# Patient Record
Sex: Female | Born: 1970 | Race: Black or African American | Hispanic: No | Marital: Married | Smoking: Never smoker
Health system: Southern US, Community
[De-identification: ages and names within clinical notes are randomized; demographics above are authoritative.]

## PROBLEM LIST (undated history)

## (undated) DIAGNOSIS — D75A Glucose-6-phosphate dehydrogenase (G6PD) deficiency without anemia: Secondary | ICD-10-CM

## (undated) DIAGNOSIS — B009 Herpesviral infection, unspecified: Secondary | ICD-10-CM

## (undated) DIAGNOSIS — F329 Major depressive disorder, single episode, unspecified: Secondary | ICD-10-CM

## (undated) DIAGNOSIS — D649 Anemia, unspecified: Secondary | ICD-10-CM

## (undated) DIAGNOSIS — F32A Depression, unspecified: Secondary | ICD-10-CM

## (undated) DIAGNOSIS — E559 Vitamin D deficiency, unspecified: Secondary | ICD-10-CM

## (undated) DIAGNOSIS — T7840XA Allergy, unspecified, initial encounter: Secondary | ICD-10-CM

## (undated) HISTORY — DX: Glucose-6-phosphate dehydrogenase (G6PD) deficiency without anemia: D75.A

## (undated) HISTORY — PX: ABLATION: SHX5711

## (undated) HISTORY — DX: Depression, unspecified: F32.A

## (undated) HISTORY — DX: Anemia, unspecified: D64.9

## (undated) HISTORY — DX: Herpesviral infection, unspecified: B00.9

## (undated) HISTORY — DX: Vitamin D deficiency, unspecified: E55.9

## (undated) HISTORY — DX: Allergy, unspecified, initial encounter: T78.40XA

## (undated) HISTORY — DX: Major depressive disorder, single episode, unspecified: F32.9

---

## 1994-11-30 HISTORY — PX: WISDOM TOOTH EXTRACTION: SHX21

## 1998-09-18 ENCOUNTER — Other Ambulatory Visit: Admission: RE | Admit: 1998-09-18 | Discharge: 1998-09-18 | Payer: Self-pay | Admitting: *Deleted

## 2000-06-18 ENCOUNTER — Inpatient Hospital Stay (HOSPITAL_COMMUNITY): Admission: AD | Admit: 2000-06-18 | Discharge: 2000-06-20 | Payer: Self-pay | Admitting: Obstetrics and Gynecology

## 2000-07-15 ENCOUNTER — Encounter: Admission: RE | Admit: 2000-07-15 | Discharge: 2000-10-13 | Payer: Self-pay | Admitting: Obstetrics and Gynecology

## 2000-10-15 ENCOUNTER — Encounter: Admission: RE | Admit: 2000-10-15 | Discharge: 2000-12-17 | Payer: Self-pay | Admitting: Obstetrics and Gynecology

## 2001-04-04 ENCOUNTER — Encounter: Admission: RE | Admit: 2001-04-04 | Discharge: 2001-04-04 | Payer: Self-pay | Admitting: Internal Medicine

## 2001-04-04 ENCOUNTER — Encounter: Payer: Self-pay | Admitting: Internal Medicine

## 2001-04-14 ENCOUNTER — Encounter: Admission: RE | Admit: 2001-04-14 | Discharge: 2001-07-13 | Payer: Self-pay | Admitting: Internal Medicine

## 2001-10-06 ENCOUNTER — Other Ambulatory Visit: Admission: RE | Admit: 2001-10-06 | Discharge: 2001-10-06 | Payer: Self-pay | Admitting: Obstetrics and Gynecology

## 2002-12-21 ENCOUNTER — Other Ambulatory Visit: Admission: RE | Admit: 2002-12-21 | Discharge: 2002-12-21 | Payer: Self-pay | Admitting: Obstetrics and Gynecology

## 2003-03-14 ENCOUNTER — Ambulatory Visit (HOSPITAL_COMMUNITY): Admission: RE | Admit: 2003-03-14 | Discharge: 2003-03-14 | Payer: Self-pay | Admitting: Obstetrics and Gynecology

## 2003-03-14 ENCOUNTER — Encounter: Payer: Self-pay | Admitting: Obstetrics and Gynecology

## 2003-07-21 ENCOUNTER — Inpatient Hospital Stay (HOSPITAL_COMMUNITY): Admission: AD | Admit: 2003-07-21 | Discharge: 2003-07-23 | Payer: Self-pay | Admitting: Obstetrics and Gynecology

## 2003-12-24 ENCOUNTER — Other Ambulatory Visit: Admission: RE | Admit: 2003-12-24 | Discharge: 2003-12-24 | Payer: Self-pay | Admitting: Obstetrics and Gynecology

## 2004-12-25 ENCOUNTER — Other Ambulatory Visit: Admission: RE | Admit: 2004-12-25 | Discharge: 2004-12-25 | Payer: Self-pay | Admitting: Obstetrics and Gynecology

## 2005-05-01 ENCOUNTER — Encounter: Admission: RE | Admit: 2005-05-01 | Discharge: 2005-05-01 | Payer: Self-pay | Admitting: Internal Medicine

## 2005-06-05 ENCOUNTER — Encounter: Admission: RE | Admit: 2005-06-05 | Discharge: 2005-06-05 | Payer: Self-pay | Admitting: Internal Medicine

## 2006-01-06 ENCOUNTER — Other Ambulatory Visit: Admission: RE | Admit: 2006-01-06 | Discharge: 2006-01-06 | Payer: Self-pay | Admitting: Obstetrics and Gynecology

## 2007-03-30 ENCOUNTER — Emergency Department (HOSPITAL_COMMUNITY): Admission: EM | Admit: 2007-03-30 | Discharge: 2007-03-30 | Payer: Self-pay | Admitting: Emergency Medicine

## 2010-08-06 ENCOUNTER — Encounter: Admission: RE | Admit: 2010-08-06 | Discharge: 2010-08-06 | Payer: Self-pay | Admitting: Obstetrics and Gynecology

## 2010-08-08 ENCOUNTER — Encounter: Admission: RE | Admit: 2010-08-08 | Discharge: 2010-08-08 | Payer: Self-pay | Admitting: Obstetrics and Gynecology

## 2010-08-11 ENCOUNTER — Encounter: Admission: RE | Admit: 2010-08-11 | Discharge: 2010-08-11 | Payer: Self-pay | Admitting: Obstetrics and Gynecology

## 2011-04-17 NOTE — H&P (Signed)
NAME:  Mackenzie Jones, Mackenzie Jones                             ACCOUNT NO.:  [id number]   MEDICAL RECORD NO.:  [id number]                   PATIENT TYPE:  INP   LOCATION:  9306                                 FACILITY:  [facility]   PHYSICIAN:  Mackenzie Jones, C.N.M.               DATE OF BIRTH:  Aug 03, 1971   DATE OF ADMISSION:  07/21/2003  DATE OF DISCHARGE:                                HISTORY & PHYSICAL   Mackenzie Jones is a 40 year old married black female gravida 3, para 1-0-1-1 at 41-  4/7 weeks who presents with the urge to push.  She denies ruptured membranes  but she has had positive bloody show and irregular uterine contractions.  Her pregnancy has been followed the [facility] M.D. service and  has been remarkable for (1)  History of HSV.  (2)  Irregular cycles.  (3)  First trimester spotting.  (4)  Positive Group B strep.  Her prenatal labs  were collected on December 21, 2002 hemoglobin was 11.9, hematocrit 35.0,  platelets 315,000, blood type AB positive, antibody negative, RPR  nonreactive, rubella immune, hepatitis B surface antigen negative, HIV  nonreactive, Pap smear within normal limits.  Cystic fibrosis testing  negative.  On May 04, 2003, her one hour Glucola was 123 and her hemoglobin  at that time was 10.4.  Culture of the vaginal tract for Group B strep on  July 03, 2003 was positive and gonorrhea and Chlamydia, at that same time,  were negative.   HISTORY OF PRESENT PREGNANCY:  She presented for care are [facility]  OB/GYN  on December 21, 2002 at about eight weeks gestation.  Pregnancy  ultrasonography, at that time, showed an eight week one day pregnancy with  positive cardiac activity.  Second pregnancy ultrasonography at [redacted] weeks  gestation showed growth consistent with previous dating, an EIF in the left  ventricle was noted.  Repeat pregnancy ultrasonography, at [facility],  showed the EIF as well but no other findings that suggested trisomy 21.  The  rest  of her prenatal care has been unremarkable.   OB HISTORY:  She is a gravida 3, para 1-0-1-1.  In 1990 at eight weeks  gestation she had a spontaneous AB.  In July 2001 she vaginally delivered a  female infant weighing 6 pounds and 9 ounces at [redacted] weeks gestation after 17  hours of labor.  She had an epidural for anesthesia.  The infant's name was  Mackenzie Jones.   MEDICAL HISTORY:  1. She gets hives from FLAGYL and SULFA.  2. She has used oral contraceptives in the past.  3. She has been diagnosed with HSV with sporadic outbreaks.   SURGICAL HISTORY:  1. Remarkable for wisdom tooth extraction.   FAMILY MEDICAL HISTORY:  Negative.   GENETIC HISTORY:  Negative.   SOCIAL HISTORY:  Patient is married to the father of the baby.  His  name is  Mackenzie Jones.  They are of the [employer] faith.  They are both college educated and  employed full time.  Deny any alcohol, tobacco, or illicit drug use with the  pregnancy.   OBJECTIVE DATA:  VITAL SIGNS:  Stable.  She is afebrile.  HEENT:  Grossly within normal limits.  CHEST:  Clear to auscultation.  HEART:  Regular rate and rhythm.  ABDOMEN:  Gravid in contour with fundal height extending approximately 40-cm  above pubic symphysis.  Fetal heart rate is intermittent in the 140s.  She  is having frequent uterine contractions.  Cervix is completely dilated, 100%  effaced with bulging membranes.   ASSESSMENT:  1. Intrauterine pregnancy at term.  2. Beginning second stage.   PLAN:  Admit to [facility] and anticipate normal spontaneous vaginal  birth.                                               Mackenzie Jones, C.N.M.    KS/MEDQ  D:  07/21/2003  T:  07/21/2003  Job:  [id number]

## 2011-04-17 NOTE — H&P (Signed)
[facility]  Patient:    Mackenzie Jones, Mackenzie Jones                          MRN: [id number] Adm. Date:  [id number] Attending:  Pleas Koch Dictator:   Mack Guise, C.N.M.                         History and Physical  HISTORY OF PRESENT ILLNESS:   [name] is a 40 year old, gravida 2, para 0-0-1-0, at 4 weeks, EDD July 02, 2000, who presents with spontaneous rupture of membranes at home at approximately 3 this morning for clear fluid. She was not having any contractions prior to rupture of membranes, she was asleep but had been having some irregular contractions earlier in the afternoon prior.  She reports positive fetal movement, no bleeding.  She is now contracting mildly and irregularly.  She denies any headache, visual changes, or epigastric pain.  She does have a history of HSV but does not have any current outbreak per her report.  PRENATAL COURSE:              Her pregnancy has been followed by the MD service at [facility] and is remarkable for:                               1. History of HSV.                               2. Irregular cycles.                               3. Group B strep positive.  The patient was initially evaluated at the office of [facility] on November 12, 1999, at approximately six weeks gestation.  Her pregnancy has been essentially unremarkable.  Her EDD was determined by early pregnancy ultrasonography at eight weeks zero days due to irregular cycles, and throughout the prenatal course the growth of the uterine fundus has been noted to be compatible in size with her dates.  Systolic blood pressure has ranged from 100 to 110.  Diastolic blood pressure has ranged from 60 to 70.  There has been no significant proteinuria.  PRENATAL LABORATORY DATA:     Prenatal blood work on November 25, 1999, hemoglobulin and hematocrit 12.3 and 35.8, respectively, platelets 408,000. Blood type AB positive.  Antibody screen negative.  Sickle  cell trait negative.  VDRL nonreactive.  Rubella-immune.  Hepatitis B surface antigen negative.  AFT/free beta hCG within normal range on Apr 14, 2000, at 28 weeks. One-hour glucose challenge 126 and hemoglobin 10.4.  At 36 weeks culture of the vaginal tract is positive for group B strep.  OBSTETRICAL HISTORY:          In 1990 spontaneous AB with no complications in the present pregnancy.  PAST MEDICAL HISTORY:         History of HSV.  No outbreaks with this pregnancy.  PAST SURGICAL HISTORY:        Wisdom teeth.  FAMILY HISTORY:               Unremarkable.  GENETIC HISTORY:              There is no family history  of genetic or chromosomal disorders, children that died in infancy or that were born with birth defects.  ALLERGIES:                    FLAGYL and SULFA which cause hives.  HABITS:                       The patient denies the use of tobacco, alcohol, or illicit drugs.  SOCIAL HISTORY:               [name] is a 40 year old African-American married female.  Her husband Oleva Koo is involved and supportive.  They are of the [facility] [employer] faith.  OBJECTIVE:  VITAL SIGNS:                  Stable, afebrile.  HEENT:                        Unremarkable.  Thyroid is not enlarged.  HEART:                        Regular rate and rhythm.  LUNGS:                        Clear.  ABDOMEN:                      Gravid and contour.  Uterine and fundus is noted to extend 38 cm above the pubic symphysis.  Leopold maneuver finds the infant to be longitudinal lie, cephalic presentation and the estimated fetal weight is 7 pounds.  PELVIC:                       Sterile speculum examination finds grossly ruptured membranes.  No HSV lesions noted externally or internally on examination.  Nitrazine positive.  Fern positive.  Positive pooling.  Cervix is 2 cm dilated, 90% effaced with the cephalic presenting part at a -1 station and the baseline of the heart rate monitor is 140 with  average long-term variability.  Reactivity is present with no periodic changes.  ASSESSMENT:                   Intrauterine pregnancy, at term, premature rupture of membranes, early labor.  PLAN:                         Admit per Dr. Trudie Reed, routine MD orders. Begin penicillin G prophylaxis for positive group B strep. DD:  06/18/00 TD:  06/18/00 Job: [id number]

## 2011-07-07 ENCOUNTER — Other Ambulatory Visit: Payer: Self-pay | Admitting: Obstetrics and Gynecology

## 2011-07-07 DIAGNOSIS — Z1231 Encounter for screening mammogram for malignant neoplasm of breast: Secondary | ICD-10-CM

## 2011-08-13 ENCOUNTER — Ambulatory Visit: Payer: Self-pay

## 2011-08-28 ENCOUNTER — Ambulatory Visit: Payer: Self-pay

## 2011-09-03 ENCOUNTER — Ambulatory Visit
Admission: RE | Admit: 2011-09-03 | Discharge: 2011-09-03 | Disposition: A | Discharge status: Home / Self Care | Payer: BC Managed Care – PPO | Source: Ambulatory Visit | Attending: Obstetrics and Gynecology | Admitting: Obstetrics and Gynecology

## 2011-09-03 DIAGNOSIS — Z1231 Encounter for screening mammogram for malignant neoplasm of breast: Secondary | ICD-10-CM

## 2011-12-01 HISTORY — PX: OTHER SURGICAL HISTORY: SHX169

## 2012-06-21 ENCOUNTER — Other Ambulatory Visit: Payer: Self-pay | Admitting: Obstetrics and Gynecology

## 2012-06-21 DIAGNOSIS — Z1231 Encounter for screening mammogram for malignant neoplasm of breast: Secondary | ICD-10-CM

## 2012-09-06 ENCOUNTER — Ambulatory Visit: Payer: BC Managed Care – PPO

## 2012-09-28 ENCOUNTER — Telehealth: Payer: Self-pay | Admitting: Obstetrics and Gynecology

## 2012-09-28 ENCOUNTER — Encounter: Payer: Self-pay | Admitting: Obstetrics and Gynecology

## 2012-09-28 ENCOUNTER — Ambulatory Visit (INDEPENDENT_AMBULATORY_CARE_PROVIDER_SITE_OTHER): Payer: BC Managed Care – PPO | Admitting: Obstetrics and Gynecology

## 2012-09-28 VITALS — BP 120/78 | Wt 170.0 lb

## 2012-09-28 DIAGNOSIS — A499 Bacterial infection, unspecified: Secondary | ICD-10-CM

## 2012-09-28 DIAGNOSIS — Z113 Encounter for screening for infections with a predominantly sexual mode of transmission: Secondary | ICD-10-CM

## 2012-09-28 DIAGNOSIS — N898 Other specified noninflammatory disorders of vagina: Secondary | ICD-10-CM

## 2012-09-28 DIAGNOSIS — B9689 Other specified bacterial agents as the cause of diseases classified elsewhere: Secondary | ICD-10-CM

## 2012-09-28 DIAGNOSIS — N760 Acute vaginitis: Secondary | ICD-10-CM

## 2012-09-28 LAB — POCT WET PREP (WET MOUNT)

## 2012-09-28 MED ORDER — CLINDAMYCIN HCL 300 MG PO CAPS: 300.0000 mg | ORAL_CAPSULE | Freq: Two times a day (BID) | ORAL | Status: DC

## 2012-09-28 NOTE — Progress Notes (Signed)
Color: white Odor: yes Itching:no Thin:yes Thick:no Fever:no Dyspareunia:no Hx PID:no HX ZOX:[id number] Pelvic Pain:no Desires Gc/CT:yes Desires HIV,RPR,HbsAG:no

## 2012-09-28 NOTE — Patient Instructions (Addendum)
Bacterial Vaginosis Bacterial vaginosis (BV) is a vaginal infection where the normal balance of bacteria in the vagina is disrupted. The normal balance is then replaced by an overgrowth of certain bacteria. There are several different kinds of bacteria that can cause BV. BV is the most common vaginal infection in women of childbearing age. CAUSES   The cause of BV is not fully understood. BV develops when there is an increase or imbalance of harmful bacteria.  Some activities or behaviors can upset the normal balance of bacteria in the vagina and put women at increased risk including:  Having a new sex partner or multiple sex partners.  Douching.  Using an intrauterine device (IUD) for contraception.  It is not clear what role sexual activity plays in the development of BV. However, women that have never had sexual intercourse are rarely infected with BV. Women do not get BV from toilet seats, bedding, swimming pools or from touching objects around them.  SYMPTOMS   Grey vaginal discharge.  A fish-like odor with discharge, especially after sexual intercourse.  Itching or burning of the vagina and vulva.  Burning or pain with urination.  Some women have no signs or symptoms at all. DIAGNOSIS  Your caregiver must examine the vagina for signs of BV. Your caregiver will perform lab tests and look at the sample of vaginal fluid through a microscope. They will look for bacteria and abnormal cells (clue cells), a pH test higher than 4.5, and a positive amine test all associated with BV.  RISKS AND COMPLICATIONS   Pelvic inflammatory disease (PID).  Infections following gynecology surgery.  Developing HIV.  Developing herpes virus. TREATMENT  Sometimes BV will clear up without treatment. However, all women with symptoms of BV should be treated to avoid complications, especially if gynecology surgery is planned. Female partners generally do not need to be treated. However, BV may spread  between female sex partners so treatment is helpful in preventing a recurrence of BV.   BV may be treated with antibiotics. The antibiotics come in either pill or vaginal cream forms. Either can be used with nonpregnant or pregnant women, but the recommended dosages differ. These antibiotics are not harmful to the baby.  BV can recur after treatment. If this happens, a second round of antibiotics will often be prescribed.  Treatment is important for pregnant women. If not treated, BV can cause a premature delivery, especially for a pregnant woman who had a premature birth in the past. All pregnant women who have symptoms of BV should be checked and treated.  For chronic reoccurrence of BV, treatment with a type of prescribed gel vaginally twice a week is helpful. HOME CARE INSTRUCTIONS   Finish all medication as directed by your caregiver.  Do not have sex until treatment is completed.  Tell your sexual partner that you have a vaginal infection. They should see their caregiver and be treated if they have problems, such as a mild rash or itching.  Practice safe sex. Use condoms. Only have 1 sex partner. PREVENTION  Basic prevention steps can help reduce the risk of upsetting the natural balance of bacteria in the vagina and developing BV:  Do not have sexual intercourse (be abstinent).  Do not douche.  Use all of the medicine prescribed for treatment of BV, even if the signs and symptoms go away.  Tell your sex partner if you have BV. That way, they can be treated, if needed, to prevent reoccurrence. SEEK MEDICAL CARE IF:     Your symptoms are not improving after 3 days of treatment.  You have increased discharge, pain, or fever. MAKE SURE YOU:   Understand these instructions.  Will watch your condition.  Will get help right away if you are not doing well or get worse. FOR MORE INFORMATION  Division of STD Prevention (DSTDP), Centers for Disease Control and Prevention:  www.cdc.gov/std American Social Health Association (ASHA): www.ashastd.org  Document Released: 11/16/2005 Document Revised: 02/08/2012 Document Reviewed: 05/09/2009 ExitCare Patient Information 2013 [employer].   Avoid: - excess soap on genital area (consider using plain oatmeal soap) - use of powder or sprays in genital area - douching - wearing underwear to bed (except with menses) - using more than is directed detergent when washing clothes - tight fitting garments around genital area - excess sugar intake   

## 2012-09-28 NOTE — Progress Notes (Signed)
41 YO for GC/CT  testing complaining of malodorous vaginal discharge.  O:  Pelvic:  EGBUS-wnl, vagina-thin grey discharge, cervix-no lesions, uterus/adnexae-normal  Wet Prep: ph-5.0,  whiff-positive,  many clue cells  A: Bacterial Vaginosis     Flagyl allergy (hives)  P:  Perineal hygiene       Clindamycin 300 mg #14 bid x 7 days       RTO-as scheduled or prn  Glen Blatchley, PA-C

## 2012-10-04 ENCOUNTER — Other Ambulatory Visit: Payer: Self-pay | Admitting: Obstetrics and Gynecology

## 2012-10-04 MED ORDER — FLUCONAZOLE 150 MG PO TABS: ORAL_TABLET | ORAL | Status: DC

## 2012-10-04 NOTE — Telephone Encounter (Signed)
Patient treated within the last week for BV, declined yeast prophalaxis at the time, now requesting yeast medication.  Patient may have Diflucan 150 mg #1 1 po stat with 1 refill to use 3 days later prn  or she may have Terazol 7 Vaginal Cream #1 tube 1 applicatorful pv qhs x 7 days with no refill.  Brennan Karam, PA-C

## 2012-10-04 NOTE — Telephone Encounter (Signed)
Medication Refill

## 2012-10-04 NOTE — Telephone Encounter (Signed)
Tc to pt regarding EP msg, pt chooses Diflucan, rx e-prescribed to pt's pharmacy.

## 2012-10-21 ENCOUNTER — Ambulatory Visit
Admission: RE | Admit: 2012-10-21 | Discharge: 2012-10-21 | Disposition: A | Discharge status: Home / Self Care | Payer: BC Managed Care – PPO | Source: Ambulatory Visit | Attending: Obstetrics and Gynecology | Admitting: Obstetrics and Gynecology

## 2012-10-21 DIAGNOSIS — Z1231 Encounter for screening mammogram for malignant neoplasm of breast: Secondary | ICD-10-CM

## 2012-11-11 ENCOUNTER — Ambulatory Visit (INDEPENDENT_AMBULATORY_CARE_PROVIDER_SITE_OTHER): Payer: BC Managed Care – PPO | Admitting: Obstetrics and Gynecology

## 2012-11-11 ENCOUNTER — Encounter: Payer: Self-pay | Admitting: Obstetrics and Gynecology

## 2012-11-11 VITALS — BP 118/78 | Ht 60.0 in | Wt 155.0 lb

## 2012-11-11 DIAGNOSIS — Z01419 Encounter for gynecological examination (general) (routine) without abnormal findings: Secondary | ICD-10-CM

## 2012-11-11 MED ORDER — VALACYCLOVIR HCL 500 MG PO TABS: 500.0000 mg | ORAL_TABLET | Freq: Every day | ORAL | Status: AC

## 2012-11-11 NOTE — Progress Notes (Signed)
ANNUAL GYNECOLOGIC EXAMINATION   Mackenzie Jones is a 41 y.o. female, G3P2, who presents for an annual exam. She is doing well. She had an endometrial ablation in 2009. She occasionally has bleeding, but is very pleased. She has herpes but has  Infrequent outbreaks.   History   Social History  . Marital Status: Married    Spouse Name: N/A    Number of Children: N/A  . Years of Education: N/A   Social History Main Topics  . Smoking status: Never Smoker   . Smokeless tobacco: Never Used  . Alcohol Use: No  . Drug Use: No  . Sexually Active: Yes    Birth Control/ Protection: Surgical     Comment: vas   Other Topics Concern  . None   Social History Narrative  . None    Menstrual cycle:   LMP: No LMP recorded. Patient has had an ablation.             The following portions of the patient's history were reviewed and updated as appropriate: allergies, current medications, past family history, past medical history, past social history, past surgical history and problem list.  Review of Systems Pertinent items are noted in HPI. Breast:Negative for breast lump,nipple discharge or nipple retraction Gastrointestinal: Negative for abdominal pain, change in bowel habits or rectal bleeding Urinary:negative   Objective:    BP 118/78  Ht 5' (1.524 m)  Wt 155 lb (70.308 kg)  BMI 30.27 kg/m2    Weight:  Wt Readings from Last 1 Encounters:  11/11/12 155 lb (70.308 kg)          BMI: Body mass index is 30.27 kg/(m^2).  General Appearance: Alert, appropriate appearance for age. No acute distress HEENT: Grossly normal Neck / Thyroid: Supple, no masses, nodes or enlargement Lungs: clear to auscultation bilaterally Back: No CVA tenderness Breast Exam: No masses or nodes.No dimpling, nipple retraction or discharge. Cardiovascular: Regular rate and rhythm. S1, S2, no murmur Gastrointestinal: Soft, non-tender, no masses or  organomegaly  ++++++++++++++++++++++++++++++++++++++++++++++++++++++++  Pelvic Exam: External genitalia: normal general appearance Vaginal: normal without tenderness, induration or masses. Relaxation: No Cervix: normal appearance Adnexa: normal bimanual exam Uterus: normal size, shape, and consistency Rectovaginal: normal rectal, no masses  ++++++++++++++++++++++++++++++++++++++++++++++++++++++++  Lymphatic Exam: Non-palpable nodes in neck, clavicular, axillary, or inguinal regions Neurologic: Normal speech, no tremor  Psychiatric: Alert and oriented, appropriate affect.  Assessment:    Normal gyn exam   Overweight or obese: Yes   Pelvic relaxation: No  Herpes    Plan:    mammogram pap smear return annually or prn Contraception:vasectomy    Medications prescribed: Valtrex 500 mg daily  STD screen request: No   The updated Pap smear screening guidelines were discussed with the patient. The patient requested that I obtain a Pap smear: Yes.  Kegel exercises discussed: Yes.  Proper diet and regular exercise were reviewed.  Annual mammograms recommended starting at age 21. Proper breast care was discussed.  Screening colonoscopy is recommended beginning at age 28.  Regular health maintenance was reviewed.  Sleep hygiene was discussed.  Adequate calcium and vitamin D intake was emphasized.  Leonard Schwartz M.D.    Regular Periods: no Ablation  Mammogram: yes  Monthly Breast Ex.: yes Exercise: yes  Tetanus < 10 years: yes Seatbelts: yes  NI. Bladder Functn.: yes Abuse at home: no  Daily BM's: yes Stressful Work: no   Healthy Diet: yes Sigmoid-Colonoscopy: yes years ago   Calcium: yes Medical problems this year:  none    LAST PAP:01/2009  Contraception: vas/ablation  Mammogram:  09/2012  PCP: Dr.Shelton PMH: unchanged   FMH: unchanged   Pt stated no issues today.

## 2012-11-11 NOTE — Addendum Note (Signed)
Addended by: Tim Lair on: 11/11/2012 10:30 AM   Modules accepted: Orders

## 2012-11-14 LAB — PAP IG W/ RFLX HPV ASCU

## 2013-09-20 ENCOUNTER — Other Ambulatory Visit: Payer: Self-pay

## 2013-09-20 DIAGNOSIS — Z1231 Encounter for screening mammogram for malignant neoplasm of breast: Secondary | ICD-10-CM

## 2013-10-24 ENCOUNTER — Ambulatory Visit
Admission: RE | Admit: 2013-10-24 | Discharge: 2013-10-24 | Disposition: A | Discharge status: Home / Self Care | Payer: BC Managed Care – PPO | Source: Ambulatory Visit

## 2013-10-24 DIAGNOSIS — Z1231 Encounter for screening mammogram for malignant neoplasm of breast: Secondary | ICD-10-CM

## 2014-08-21 ENCOUNTER — Other Ambulatory Visit: Payer: Self-pay

## 2014-08-21 DIAGNOSIS — Z1231 Encounter for screening mammogram for malignant neoplasm of breast: Secondary | ICD-10-CM

## 2014-10-01 ENCOUNTER — Encounter: Payer: Self-pay | Admitting: Obstetrics and Gynecology

## 2014-10-29 ENCOUNTER — Ambulatory Visit
Admission: RE | Admit: 2014-10-29 | Discharge: 2014-10-29 | Disposition: A | Discharge status: Home / Self Care | Payer: BC Managed Care – PPO | Source: Ambulatory Visit

## 2014-10-29 DIAGNOSIS — Z1231 Encounter for screening mammogram for malignant neoplasm of breast: Secondary | ICD-10-CM

## 2015-06-11 ENCOUNTER — Other Ambulatory Visit: Payer: Self-pay | Admitting: Family Medicine

## 2015-06-11 ENCOUNTER — Ambulatory Visit (INDEPENDENT_AMBULATORY_CARE_PROVIDER_SITE_OTHER): Payer: Self-pay | Admitting: Family Medicine

## 2015-06-11 ENCOUNTER — Encounter: Payer: Self-pay | Admitting: Family Medicine

## 2015-06-11 VITALS — BP 126/81 | HR 81 | Ht 60.0 in | Wt 167.0 lb

## 2015-06-11 DIAGNOSIS — E669 Obesity, unspecified: Secondary | ICD-10-CM

## 2015-06-11 DIAGNOSIS — M7042 Prepatellar bursitis, left knee: Secondary | ICD-10-CM

## 2015-06-11 DIAGNOSIS — Z6834 Body mass index (BMI) 34.0-34.9, adult: Secondary | ICD-10-CM | POA: Insufficient documentation

## 2015-06-11 DIAGNOSIS — M7040 Prepatellar bursitis, unspecified knee: Secondary | ICD-10-CM | POA: Insufficient documentation

## 2015-06-11 LAB — CBC
HCT: 38 % (ref 36.0–46.0)
Hemoglobin: 12.7 g/dL (ref 12.0–15.0)
MCH: 29.2 pg (ref 26.0–34.0)
MCHC: 33.4 g/dL (ref 30.0–36.0)
MCV: 87.4 fL (ref 78.0–100.0)
MPV: 9.4 fL (ref 8.6–12.4)
Platelets: 345 10*3/uL (ref 150–400)
RBC: 4.35 MIL/uL (ref 3.87–5.11)
RDW: 13.3 % (ref 11.5–15.5)
WBC: 8.1 10*3/uL (ref 4.0–10.5)

## 2015-06-11 LAB — LIPID PANEL
Cholesterol: 222 mg/dL — ABNORMAL HIGH (ref 0–200)
HDL: 44 mg/dL — ABNORMAL LOW (ref >=46)
LDL Cholesterol: 142 mg/dL — ABNORMAL HIGH (ref 0–99)
Total CHOL/HDL Ratio: 5 Ratio
Triglycerides: 182 mg/dL — ABNORMAL HIGH (ref <150)
VLDL: 36 mg/dL (ref 0–40)

## 2015-06-11 MED ORDER — PHENTERMINE HCL 37.5 MG PO CAPS: 37.5000 mg | ORAL_CAPSULE | ORAL | Status: DC

## 2015-06-11 MED ORDER — DICLOFENAC SODIUM 1 % TD GEL: 2.0000 g | Freq: Four times a day (QID) | TRANSDERMAL | Status: DC

## 2015-06-11 NOTE — Patient Instructions (Signed)
Thank you for coming in today. Work on eccentric ( negative) squats with an end knee angle greater than 90 Start phentermine in the morning. Return in one month to recheck weight and blood pressure. I will call if labs are significantly abnormal otherwise will discuss them at the next visit.  Prepatellar Bursitis with Rehab  Bursitis is a condition that is characterized by inflammation of a bursa. Saunders Revel exists in many areas of the body. They are fluid-filled sacs that lie between a soft tissue (skin, tendon, or ligament) and a bone, and they reduce friction between the structures as well as the stress placed on the soft tissue. Prepatellar bursitis is inflammation of the bursa that lies between the skin and the kneecap (patella). This condition often causes pain over the patella. SYMPTOMS   Pain, tenderness, and/or inflammation over the patella.  Pain that worsens with movement of the knee joint.  Decreased range of motion for the knee joint.  A crackling sound (crepitation) when the bursa is moved or touched.  Occasionally, painless swelling of the bursa.  Fever (when infected). CAUSES  Bursitis is caused by damage to the bursa, which results in an inflammatory response. Common mechanisms of injury include:  Direct trauma to the front of the knee.  Repetitive and/or stressful use of the knee. RISK INCREASES WITH:  Activities in which kneeling and/or falling on one's knees is likely (volleyball or football).  Repetitive and stressful training, especially if it involves running on hills.  Improper training techniques, such as a sudden increase in the intensity, frequency, or duration of training.  Failure to warm up properly before activity.  Poor technique.  Artificial turf. PREVENTION   Avoid kneeling or falling on your knees.  Warm up and stretch properly before activity.  Allow for adequate recovery between workouts.  Maintain physical fitness:  Strength,  flexibility, and endurance.  Cardiovascular fitness.  Learn and use proper technique. When possible, have a coach correct improper technique.  Wear properly fitted and padded protective equipment (kneepads). PROGNOSIS  If treated properly, then the symptoms of prepatellar bursitis usually resolve within 2 weeks. RELATED COMPLICATIONS   Recurrent symptoms that result in a chronic problem.  Prolonged healing time, if improperly treated or reinjured.  Limited range of motion.  Infection of bursa.  Chronic inflammation or scarring of bursa. TREATMENT  Treatment initially involves the use of ice and medication to help reduce pain and inflammation. The use of strengthening and stretching exercises may help reduce pain with activity, especially those of the quadriceps and hamstring muscles. These exercises may be performed at home or with referral to a therapist. Your caregiver may recommend kneepads when you return to playing sports, in order to reduce the stress on the prepatellar bursa. If symptoms persist despite treatment, then your caregiver may drain fluid out with a needle (aspirate) the bursa. If symptoms persist for greater than 6 months despite nonsurgical (conservative) treatment, then surgery may be recommended to remove the bursa.  MEDICATION  If pain medication is necessary, then nonsteroidal anti-inflammatory medications, such as aspirin and ibuprofen, or other minor pain relievers, such as acetaminophen, are often recommended.  Do not take pain medication for 7 days before surgery.  Prescription pain relievers may be given if deemed necessary by your caregiver. Use only as directed and only as much as you need.  Corticosteroid injections may be given by your caregiver. These injections should be reserved for the most serious cases, because they may only be given a  certain number of times. HEAT AND COLD  Cold treatment (icing) relieves pain and reduces inflammation. Cold  treatment should be applied for 10 to 15 minutes every 2 to 3 hours for inflammation and pain and immediately after any activity that aggravates your symptoms. Use ice packs or massage the area with a piece of ice (ice massage).  Heat treatment may be used prior to performing the stretching and strengthening activities prescribed by your caregiver, physical therapist, or athletic trainer. Use a heat pack or soak the injury in warm water. SEEK MEDICAL CARE IF:  Treatment seems to offer no benefit, or the condition worsens.  Any medications produce adverse side effects. EXERCISES RANGE OF MOTION (ROM) AND STRETCHING EXERCISES - Prepatellar Bursitis These exercises may help you when beginning to rehabilitate your injury. Your symptoms may resolve with or without further involvement from your physician, physical therapist or athletic trainer. While completing these exercises, remember:   Restoring tissue flexibility helps normal motion to return to the joints. This allows healthier, less painful movement and activity.  An effective stretch should be held for at least 30 seconds.  A stretch should never be painful. You should only feel a gentle lengthening or release in the stretched tissue. STRETCH - Hamstrings, Standing  Stand or sit and extend your right / left leg, placing your foot on a chair or foot stool  Keeping a slight arch in your low back and your hips straight forward.  Lead with your chest and lean forward at the waist until you feel a gentle stretch in the back of your right / left knee or thigh. (When done correctly, this exercise requires leaning only a small distance.)  Hold this position for __________ seconds. Repeat __________ times. Complete this stretch __________ times per day. STRETCH - Quadriceps, Prone   Lie on your stomach on a firm surface, such as a bed or padded floor.  Bend your right / left knee and grasp your ankle. If you are unable to reach, your ankle or  pant leg, use a belt around your foot to lengthen your reach.  Gently pull your heel toward your buttocks. Your knee should not slide out to the side. You should feel a stretch in the front of your thigh and/or knee.  Hold this position for __________ seconds. Repeat __________ times. Complete this stretch __________ times per day.  STRETCH - Hamstrings/Adductors, V-Sit   Sit on the floor with your legs extended in a large "V," keeping your knees straight.  With your head and chest upright, bend at your waist reaching for your right foot to stretch your left adductors.  You should feel a stretch in your left inner thigh. Hold for __________ seconds.  Return to the upright position to relax your leg muscles.  Continuing to keep your chest upright, bend straight forward at your waist to stretch your hamstrings.  You should feel a stretch behind both of your thighs and/or knees. Hold for __________ seconds.  Return to the upright position to relax your leg muscles.  Repeat steps 2 through 4. Repeat __________ times. Complete this exercise __________ times per day.  STRENGTHENING EXERCISES - Prepatellar Bursitis  These exercises may help you when beginning to rehabilitate your injury. They may resolve your symptoms with or without further involvement from your physician, physical therapist or athletic trainer. While completing these exercises, remember:  Muscles can gain both the endurance and the strength needed for everyday activities through controlled exercises.  Complete these exercises  as instructed by your physician, physical therapist or athletic trainer. Progress the resistance and repetitions only as guided. STRENGTH - Quadriceps, Isometrics  Lie on your back with your right / left leg extended and your opposite knee bent.  Gradually tense the muscles in the front of your right / left thigh. You should see either your kneecap slide up toward your hip or increased dimpling  just above the knee. This motion will push the back of the knee down toward the floor/mat/bed on which you are lying.  Hold the muscle as tight as you can without increasing your pain for __________ seconds.  Relax the muscles slowly and completely in between each repetition. Repeat __________ times. Complete this exercise __________ times per day.  STRENGTH - Quadriceps, Short Arcs   Lie on your back. Place a __________ inch towel roll under your knee so that the knee slightly bends.  Raise only your lower leg by tightening the muscles in the front of your thigh. Do not allow your thigh to rise.  Hold this position for __________ seconds. Repeat __________ times. Complete this exercise __________ times per day.  OPTIONAL ANKLE WEIGHTS: Begin with ____________________, but DO NOT exceed ____________________. Increase in1 lb/0.5 kg increments.  STRENGTH - Quadriceps, Straight Leg Raises  Quality counts! Watch for signs that the quadriceps muscle is working to insure you are strengthening the correct muscles and not "cheating" by substituting with healthier muscles.  Lay on your back with your right / left leg extended and your opposite knee bent.  Tense the muscles in the front of your right / left thigh. You should see either your kneecap slide up or increased dimpling just above the knee. Your thigh may even quiver.  Tighten these muscles even more and raise your leg 4 to 6 inches off the floor. Hold for __________ seconds.  Keeping these muscles tense, lower your leg.  Relax the muscles slowly and completely in between each repetition. Repeat __________ times. Complete this exercise __________ times per day.  STRENGTH - Quadriceps, Step-Ups   Use a thick book, step or step stool that is __________ inches tall.  Holding a wall or counter for balance only, not support.  Slowly step-up with your right / left foot, keeping your knee in line with your hip and foot. Do not allow your  knee to bend so far that you cannot see your toes.  Slowly unlock your knee and lower yourself to the starting position. Your muscles, not gravity, should lower you. Repeat __________ times. Complete this exercise __________ times per day. Document Released: 11/16/2005 Document Revised: 04/02/2014 Document Reviewed: 02/28/2009 [facility] Patient Information 2015 [place], [place]. This information is not intended to replace advice given to you by your health care provider. Make sure you discuss any questions you have with your health care provider.

## 2015-06-11 NOTE — Assessment & Plan Note (Signed)
Obesity.  Body mass index is 32.62 kg/(m^2). Patient has attempted lifestyle modification and had incomplete success. I believe there is room for improvement with lifestyle with increased exercise and more adherence to her calorie restriction.  She's done well with medications in the past. Plan to start phentermine today as she has no significant contraindications. We'll check lipid panel, CMP, and CBC today for baseline labs related to her obesity. Return in one month for recheck

## 2015-06-11 NOTE — Assessment & Plan Note (Signed)
Mild left knee prepatellar bursitis. Patient is doing well. This limits her ability to exercise. Plan for topical diclofenac gel with eccentric exercises. Return in one month.

## 2015-06-11 NOTE — Progress Notes (Signed)
Mackenzie Jones is a 44 y.o. female who presents to [facility]  today for Establish care and discuss left knee pain and weight loss.  1) patient has mild anterior left knee pain worse with squats. This is limiting her ability to exercise the way she wants. She does not have knee pain with normal activities of daily living. No fevers or chills nausea vomiting or diarrhea. No radiating pain weakness or numbness. She denies any specific injury  2) obesity: Patient has obesity with a BMI of 32 today. Her goal weight is 145 pounds. In the past she was on qsymia for obesity management but this medication was discontinued when her insurance changed.  She attempts to count calories using myfitnesspal with a goal calorie of 1500 per day. She uses this intermittently. Additionally she exercises 20-30 minutes 2-3 times per week.   Past Medical History  Diagnosis Date  . 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 Ablation    . Wisdom tooth extraction  1996   History  Substance Use Topics  . Smoking status: Never Smoker   . Smokeless tobacco: Never Used  . Alcohol Use: No   ROS as above Medications: Current Outpatient Prescriptions  Medication Sig Dispense Refill  . diclofenac sodium (VOLTAREN) 1 % GEL Apply 2 g topically 4 (four) times daily. 100 g 12  . phentermine 37.5 MG capsule Take 1 capsule (37.5 mg total) by mouth every morning. 30 capsule 0   No current facility-administered medications for this visit.   Allergies  Allergen Reactions  . Flagyl [Metronidazole]   . Sulfa Antibiotics      Exam:  BP 126/81 mmHg  Pulse 81  Ht 5' (1.524 m)  Wt 167 lb (75.751 kg)  BMI 32.62 kg/m2 Gen: Well NAD HEENT: EOMI,  MMM Lungs: Normal work of breathing. CTABL Heart: RRR no MRG Abd: NABS, Soft. Nondistended, Nontender Exts: Brisk capillary refill, warm and well perfused.  Left knee:  Normal-appearing without effusion. Range of motion 0-120. Mildly tender along the proximal patella tendon. Palpable squeak present. Nontender at the lateral and medial joint lines. Stable ligamentous exam and negative McMurray's test bilaterally Intact extensor and flexor strength without pain  No results found for this or any previous visit (from the past 24 hour(s)). No results found.   Please see individual assessment and plan sections. This visit required moderate complexity and decision making.

## 2015-06-12 ENCOUNTER — Telehealth: Payer: Self-pay | Admitting: Family Medicine

## 2015-06-12 DIAGNOSIS — E785 Hyperlipidemia, unspecified: Secondary | ICD-10-CM | POA: Insufficient documentation

## 2015-06-12 LAB — COMPLETE METABOLIC PANEL WITH GFR
ALBUMIN: 4 g/dL (ref 3.5–5.2)
ALK PHOS: 101 U/L (ref 39–117)
ALT: 18 U/L (ref 0–35)
AST: 16 U/L (ref 0–37)
BILIRUBIN TOTAL: 0.5 mg/dL (ref 0.2–1.2)
BUN: 13 mg/dL (ref 6–23)
CHLORIDE: 106 meq/L (ref 96–112)
CO2: 25 mEq/L (ref 19–32)
Calcium: 9.1 mg/dL (ref 8.4–10.5)
Creat: 0.84 mg/dL (ref 0.50–1.10)
GFR, EST NON AFRICAN AMERICAN: 85 mL/min
GFR, Est African American: >89 mL/min
GLUCOSE: 92 mg/dL (ref 70–99)
POTASSIUM: 3.9 meq/L (ref 3.5–5.3)
Sodium: 139 mEq/L (ref 135–145)
Total Protein: 6.9 g/dL (ref 6.0–8.3)

## 2015-06-12 NOTE — Telephone Encounter (Signed)
Cholesterol abnormal. Discussed with patient. Will work with lifestyle modification trial of 6 months. Recheck cholesterol at that time if not better start statin.

## 2015-06-19 NOTE — Progress Notes (Signed)
Lab was unable to add CMP due to the date of collection.

## 2015-07-12 ENCOUNTER — Ambulatory Visit: Payer: Self-pay | Admitting: Family Medicine

## 2015-07-15 ENCOUNTER — Ambulatory Visit (INDEPENDENT_AMBULATORY_CARE_PROVIDER_SITE_OTHER): Payer: 59 | Admitting: Family Medicine

## 2015-07-15 ENCOUNTER — Encounter: Payer: Self-pay | Admitting: Family Medicine

## 2015-07-15 VITALS — BP 128/70 | HR 92 | Wt 156.0 lb

## 2015-07-15 DIAGNOSIS — E669 Obesity, unspecified: Secondary | ICD-10-CM | POA: Diagnosis not present

## 2015-07-15 MED ORDER — PHENTERMINE HCL 37.5 MG PO CAPS: 37.5000 mg | ORAL_CAPSULE | ORAL | Status: DC

## 2015-07-15 NOTE — Progress Notes (Signed)
Mackenzie Jones is a 44 y.o. female who presents to [facility]  today for weight loss. Patient was seen a month ago for weight management. She was started on phentermine. The past month she has lost 9 pounds. She is doing well. She exercises at least twice daily with dog walks. Her goal weight is 145 pounds. No chest pains palpitations or shortness of breath.   Past Medical History  Diagnosis Date  . 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 Ablation    . Wisdom tooth extraction  1996   Social History  Substance Use Topics  . Smoking status: Never Smoker   . Smokeless tobacco: Never Used  . Alcohol Use: No   ROS as above Medications: Current Outpatient Prescriptions  Medication Sig Dispense Refill  . diclofenac sodium (VOLTAREN) 1 % GEL Apply 2 g topically 4 (four) times daily. 100 g 12  . phentermine 37.5 MG capsule Take 1 capsule (37.5 mg total) by mouth every morning. 30 capsule 0   No current facility-administered medications for this visit.   Allergies  Allergen Reactions  . Flagyl [Metronidazole]   . Sulfa Antibiotics      Exam:  BP 128/70 mmHg  Pulse 92  Wt 156 lb (70.761 kg)  Gen: Well NAD HEENT: EOMI,  MMM Lungs: Normal work of breathing. CTABL Heart: RRR no MRG Abd: NABS, Soft. Nondistended, Nontender Exts: Brisk capillary refill, warm and well perfused.   No results found for this or any previous visit (from the past 24 hour(s)). No results found.   Please see individual assessment and plan sections.

## 2015-07-15 NOTE — Assessment & Plan Note (Signed)
Doing extremely well. Refilled phentermine. Return in one month for nurse visit phentermine check.

## 2015-07-15 NOTE — Patient Instructions (Signed)
Thank you for coming in today. Return for a nurse visit in 1 month.  Call or go to the emergency room if you get worse, have trouble breathing, have chest pains, or palpitations.

## 2015-08-07 ENCOUNTER — Other Ambulatory Visit: Payer: Self-pay

## 2015-08-07 DIAGNOSIS — Z1231 Encounter for screening mammogram for malignant neoplasm of breast: Secondary | ICD-10-CM

## 2015-08-08 ENCOUNTER — Ambulatory Visit (INDEPENDENT_AMBULATORY_CARE_PROVIDER_SITE_OTHER): Payer: 59 | Admitting: Family Medicine

## 2015-08-08 ENCOUNTER — Encounter: Payer: Self-pay | Admitting: Family Medicine

## 2015-08-08 VITALS — BP 147/80 | HR 87 | Wt 158.0 lb

## 2015-08-08 DIAGNOSIS — M25521 Pain in right elbow: Secondary | ICD-10-CM

## 2015-08-08 MED ORDER — MELOXICAM 15 MG PO TABS: 15.0000 mg | ORAL_TABLET | Freq: Every day | ORAL | Status: DC

## 2015-08-08 NOTE — Progress Notes (Signed)
Mackenzie Jones is a 44 y.o. right-hand dominant female who presents to [facility]  today for right elbow pain. Patient notes pain in the anterior aspect of the medial elbow present for about a week or 2. The pain initially was in the volar wrist. She denies any injury. She notes that she has been increasing her activity recently with exercise over the past few weeks. She denies any weakness or numbness fevers or chills. She has not really tried any medications or treatment yet. She notes that typing at work seems to exacerbate her pain. She has not had to miss work yet because of pain.   Past Medical History  Diagnosis Date  . 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 Ablation    . Wisdom tooth extraction  1996   Social History  Substance Use Topics  . Smoking status: Never Smoker   . Smokeless tobacco: Never Used  . Alcohol Use: No   family history includes Hypertension in her mother.  ROS as above Medications: Current Outpatient Prescriptions  Medication Sig Dispense Refill  . phentermine 37.5 MG capsule Take 1 capsule (37.5 mg total) by mouth every morning. 30 capsule 0  . meloxicam (MOBIC) 15 MG tablet Take 1 tablet (15 mg total) by mouth daily. 30 tablet 0   No current facility-administered medications for this visit.   Allergies  Allergen Reactions  . Flagyl [Metronidazole]   . Sulfa Antibiotics      Exam:  BP 147/80 mmHg  Pulse 87  Wt 158 lb (71.668 kg) Gen: Well NAD HEENT: EOMI,  MMM Right arm: Shoulder nontender normal motion negative impingement testing Elbow normal-appearing nontender normal motion normal strength. Nontender normal motion normal strength. Grip strength intact. Exts: Brisk capillary refill, warm and well perfused.  Neck: Nontender to midline normal neck range of motion negative Spurling's test  No results found for this or any previous  visit (from the past 24 hour(s)). No results found.   Please see individual assessment and plan sections.

## 2015-08-08 NOTE — Patient Instructions (Addendum)
Thank you for coming in today. Take meloxicam daily for pain as needed. If you take it longer than a week or 2 take it with over-the-counter omeprazole (Prilosec) to protect your stomach.  You can use ice as needed Return in a few weeks if not better  Cryotherapy Cryotherapy means treatment with cold. Ice or gel packs can be used to reduce both pain and swelling. Ice is the most helpful within the first 24 to 48 hours after an injury or flare-up from overusing a muscle or joint. Sprains, strains, spasms, burning pain, shooting pain, and aches can all be eased with ice. Ice can also be used when recovering from surgery. Ice is effective, has very few side effects, and is safe for most people to use. PRECAUTIONS  Ice is not a safe treatment option for people with:  Raynaud phenomenon. This is a condition affecting small blood vessels in the extremities. Exposure to cold may cause your problems to return.  Cold hypersensitivity. There are many forms of cold hypersensitivity, including:  Cold urticaria. Red, itchy hives appear on the skin when the tissues begin to warm after being iced.  Cold erythema. This is a red, itchy rash caused by exposure to cold.  Cold hemoglobinuria. Red blood cells break down when the tissues begin to warm after being iced. The hemoglobin that carry oxygen are passed into the urine because they cannot combine with blood proteins fast enough.  Numbness or altered sensitivity in the area being iced. If you have any of the following conditions, do not use ice until you have discussed cryotherapy with your caregiver:  Heart conditions, such as arrhythmia, angina, or chronic heart disease.  High blood pressure.  Healing wounds or open skin in the area being iced.  Current infections.  Rheumatoid arthritis.  Poor circulation.  Diabetes. Ice slows the blood flow in the region it is applied. This is beneficial when trying to stop inflamed tissues from spreading  irritating chemicals to surrounding tissues. However, if you expose your skin to cold temperatures for too long or without the proper protection, you can damage your skin or nerves. Watch for signs of skin damage due to cold. HOME CARE INSTRUCTIONS Follow these tips to use ice and cold packs safely.  Place a dry or damp towel between the ice and skin. A damp towel will cool the skin more quickly, so you may need to shorten the time that the ice is used.  For a more rapid response, add gentle compression to the ice.  Ice for no more than 10 to 20 minutes at a time. The bonier the area you are icing, the less time it will take to get the benefits of ice.  Check your skin after 5 minutes to make sure there are no signs of a poor response to cold or skin damage.  Rest 20 minutes or more between uses.  Once your skin is numb, you can end your treatment. You can test numbness by very lightly touching your skin. The touch should be so light that you do not see the skin dimple from the pressure of your fingertip. When using ice, most people will feel these normal sensations in this order: cold, burning, aching, and numbness.  Do not use ice on someone who cannot communicate their responses to pain, such as small children or people with dementia. HOW TO MAKE AN ICE PACK Ice packs are the most common way to use ice therapy. Other methods include ice massage, ice  baths, and cryosprays. Muscle creams that cause a cold, tingly feeling do not offer the same benefits that ice offers and should not be used as a substitute unless recommended by your caregiver. To make an ice pack, do one of the following:  Place crushed ice or a bag of frozen vegetables in a sealable plastic bag. Squeeze out the excess air. Place this bag inside another plastic bag. Slide the bag into a pillowcase or place a damp towel between your skin and the bag.  Mix 3 parts water with 1 part rubbing alcohol. Freeze the mixture in a  sealable plastic bag. When you remove the mixture from the freezer, it will be slushy. Squeeze out the excess air. Place this bag inside another plastic bag. Slide the bag into a pillowcase or place a damp towel between your skin and the bag. SEEK MEDICAL CARE IF:  You develop white spots on your skin. This may give the skin a blotchy (mottled) appearance.  Your skin turns blue or pale.  Your skin becomes waxy or hard.  Your swelling gets worse. MAKE SURE YOU:   Understand these instructions.  Will watch your condition.  Will get help right away if you are not doing well or get worse. Document Released: 07/13/2011 Document Revised: 04/02/2014 Document Reviewed: 07/13/2011 [facility] Patient Information 2015 [place], [place]. This information is not intended to replace advice given to you by your health care provider. Make sure you discuss any questions you have with your health care provider.

## 2015-08-08 NOTE — Assessment & Plan Note (Signed)
Pain. Unclear etiology at this time. I suspect she has pain of the muscle belly of the pronator due to exercise. Plan for low back and relative rest and ice. Return in a few days if not better. Blood pressure mildly elevated likely due to pain. Recheck in next visit. May have to discontinue phentermine.

## 2015-08-16 ENCOUNTER — Ambulatory Visit: Payer: 59

## 2015-09-02 ENCOUNTER — Ambulatory Visit (INDEPENDENT_AMBULATORY_CARE_PROVIDER_SITE_OTHER): Payer: 59 | Admitting: Family Medicine

## 2015-09-02 ENCOUNTER — Encounter: Payer: Self-pay | Admitting: Family Medicine

## 2015-09-02 VITALS — BP 154/91 | HR 95 | Wt 158.0 lb

## 2015-09-02 DIAGNOSIS — M791 Myalgia, unspecified site: Secondary | ICD-10-CM

## 2015-09-02 NOTE — Progress Notes (Signed)
Mackenzie Jones is a 44 y.o. female who presents to [facility]  today for diffuse muscle pain. Patient was seen last month for right arm pain. At that time the diagnosis was unclear. In the interim the specific arm pain has improved however she notes full body soreness. This is associated with intermittent hand swelling. She denies any specific area of significant pain. She denies any injury. No fevers chills nausea vomiting or diarrhea. No previous history of rheumatologic disease. She feels well otherwise.   Past Medical History  Diagnosis Date  . 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 Ablation    . Wisdom tooth extraction  1996   Social History  Substance Use Topics  . Smoking status: Never Smoker   . Smokeless tobacco: Never Used  . Alcohol Use: No   family history includes Hypertension in her mother.  ROS as above Medications: No current outpatient prescriptions on file.   No current facility-administered medications for this visit.   Allergies  Allergen Reactions  . Flagyl [Metronidazole]   . Sulfa Antibiotics      Exam:  BP 154/91 mmHg  Pulse 95  Wt 158 lb (71.668 kg) Gen: Well NAD HEENT: EOMI,  MMM Lungs: Normal work of breathing. CTABL Heart: RRR no MRG Abd: NABS, Soft. Nondistended, Nontender Exts: Brisk capillary refill, warm and well perfused.  MSK. No specific area of tenderness or loss of range of motion. Neuro alert and oriented normal motion and gait. He flexes her ankle and normal throughout. Strength is equal and normal throughout. Neck: Nontender normal neck range of motion negative Spurling's test.  No results found for this or any previous visit (from the past 24 hour(s)). No results found.   Please see individual assessment and plan sections.

## 2015-09-02 NOTE — Patient Instructions (Signed)
Thank you for coming in today. We will get labs today.  Return in 2-3 weeks for recheck.  Call or go to the emergency room if you get worse, have trouble breathing, have chest pains, or palpitations.    Muscle Pain Muscle pain (myalgia) may be caused by many things, including:  Overuse or muscle strain, especially if you are not in shape. This is the most common cause of muscle pain.  Injury.  Bruises.  Viruses, such as the flu.  Infectious diseases.  Fibromyalgia, which is a chronic condition that causes muscle tenderness, fatigue, and headache.  Autoimmune diseases, including lupus.  Certain drugs, including ACE inhibitors and statins. Muscle pain may be mild or severe. In most cases, the pain lasts only a short time and goes away without treatment. To diagnose the cause of your muscle pain, your health care provider will take your medical history. This means he or she will ask you when your muscle pain began and what has been happening. If you have not had muscle pain for very long, your health care provider may want to wait before doing much testing. If your muscle pain has lasted a long time, your health care provider may want to run tests right away. If your health care provider thinks your muscle pain may be caused by illness, you may need to have additional tests to rule out certain conditions.  Treatment for muscle pain depends on the cause. Home care is often enough to relieve muscle pain. Your health care provider may also prescribe anti-inflammatory medicine. HOME CARE INSTRUCTIONS Watch your condition for any changes. The following actions may help to lessen any discomfort you are feeling:  Only take over-the-counter or prescription medicines as directed by your health care provider.  Apply ice to the sore muscle:  Put ice in a plastic bag.  Place a towel between your skin and the bag.  Leave the ice on for 15-20 minutes, 3-4 times a day.  You may alternate  applying hot and cold packs to the muscle as directed by your health care provider.  If overuse is causing your muscle pain, slow down your activities until the pain goes away.  Remember that it is normal to feel some muscle pain after starting a workout program. Muscles that have not been used often will be sore at first.  Do regular, gentle exercises if you are not usually active.  Warm up before exercising to lower your risk of muscle pain.  Do not continue working out if the pain is very bad. Bad pain could mean you have injured a muscle. SEEK MEDICAL CARE IF:  Your muscle pain gets worse, and medicines do not help.  You have muscle pain that lasts longer than 3 days.  You have a rash or fever along with muscle pain.  You have muscle pain after a tick bite.  You have muscle pain while working out, even though you are in good physical condition.  You have redness, soreness, or swelling along with muscle pain.  You have muscle pain after starting a new medicine or changing the dose of a medicine. SEEK IMMEDIATE MEDICAL CARE IF:  You have trouble breathing.  You have trouble swallowing.  You have muscle pain along with a stiff neck, fever, and vomiting.  You have severe muscle weakness or cannot move part of your body. MAKE SURE YOU:   Understand these instructions.  Will watch your condition.  Will get help right away if you are not  doing well or get worse. Document Released: 10/08/2006 Document Revised: 11/21/2013 Document Reviewed: 09/12/2013 [facility] Patient Information 2015 [place], [place]. This information is not intended to replace advice given to you by your health care provider. Make sure you discuss any questions you have with your health care provider.

## 2015-09-02 NOTE — Assessment & Plan Note (Signed)
Unclear etiology. Patient does not take any medications. Check rheumatologic workup with TSH sedimentation rate and rheumatoid factor CMP CK CBC CRP and ANA. Return in a few weeks

## 2015-09-03 LAB — COMPREHENSIVE METABOLIC PANEL
ALBUMIN: 4.1 g/dL (ref 3.6–5.1)
ALK PHOS: 117 U/L — AB (ref 33–115)
ALT: 18 U/L (ref 6–29)
AST: 18 U/L (ref 10–35)
BILIRUBIN TOTAL: 0.3 mg/dL (ref 0.2–1.2)
BUN: 11 mg/dL (ref 7–25)
CHLORIDE: 104 mmol/L (ref 98–110)
CO2: 28 mmol/L (ref 20–31)
CREATININE: 0.75 mg/dL (ref 0.50–1.10)
Calcium: 9.5 mg/dL (ref 8.6–10.2)
Glucose, Bld: 85 mg/dL (ref 65–99)
Potassium: 3.8 mmol/L (ref 3.5–5.3)
SODIUM: 139 mmol/L (ref 135–146)
TOTAL PROTEIN: 6.8 g/dL (ref 6.1–8.1)

## 2015-09-03 LAB — TSH: TSH: 2.724 u[IU]/mL (ref 0.350–4.500)

## 2015-09-03 LAB — C-REACTIVE PROTEIN

## 2015-09-03 LAB — RHEUMATOID FACTOR: Rhuematoid fact SerPl-aCnc: 58 IU/mL — ABNORMAL HIGH (ref <=14)

## 2015-09-03 LAB — ANA: Anti Nuclear Antibody(ANA): NEGATIVE

## 2015-09-03 LAB — CBC
HCT: 35.1 % — ABNORMAL LOW (ref 36.0–46.0)
HEMOGLOBIN: 12.1 g/dL (ref 12.0–15.0)
MCH: 29.5 pg (ref 26.0–34.0)
MCHC: 34.5 g/dL (ref 30.0–36.0)
MCV: 85.6 fL (ref 78.0–100.0)
MPV: 9.4 fL (ref 8.6–12.4)
Platelets: 357 10*3/uL (ref 150–400)
RBC: 4.1 MIL/uL (ref 3.87–5.11)
RDW: 13.6 % (ref 11.5–15.5)
WBC: 10 10*3/uL (ref 4.0–10.5)

## 2015-09-03 LAB — SEDIMENTATION RATE: Sed Rate: 9 mm/hr (ref 0–20)

## 2015-09-03 LAB — CK: Total CK: 164 U/L (ref 7–177)

## 2015-09-03 NOTE — Addendum Note (Signed)
Addended by: Gregor Hams on: 09/03/2015 11:07 AM   Modules accepted: Orders

## 2015-09-03 NOTE — Progress Notes (Signed)
Quick Note:  Labs show a positive Rheumatoid factor which could indicated rheumatoid arthritis among others.  Plan to refer to a rheumatologist.  You should hear from them soon. ______

## 2015-09-04 ENCOUNTER — Telehealth: Payer: Self-pay | Admitting: Family Medicine

## 2015-09-04 NOTE — Telephone Encounter (Signed)
Patient is returning your call Mackenzie Jones.  Please call her back at [phone]. thanks

## 2015-09-16 ENCOUNTER — Ambulatory Visit: Payer: 59 | Admitting: Family Medicine

## 2015-09-20 ENCOUNTER — Ambulatory Visit (INDEPENDENT_AMBULATORY_CARE_PROVIDER_SITE_OTHER): Payer: 59 | Admitting: Family Medicine

## 2015-09-20 VITALS — BP 139/79 | HR 98 | Wt 163.0 lb

## 2015-09-20 DIAGNOSIS — E669 Obesity, unspecified: Secondary | ICD-10-CM

## 2015-09-20 MED ORDER — PHENTERMINE HCL 37.5 MG PO CAPS: 37.5000 mg | ORAL_CAPSULE | ORAL | Status: DC

## 2015-09-20 NOTE — Progress Notes (Signed)
Patient came into clinic today for BP and weight check regarding her Phentermine Rx. Pt states she took this Rx for 2 months then ran out because she didn't know she was supposed to come in for weight checks to get refills. Pt has not taken the Rx in 3 weeks, and her weight has gone up. Would like to get a new Rx today so she can begin the daily regime.   Pt also wanted to let PCP know she has been seeing him for right arm pain. She went to a Rheumatologist who told her she did not have RA and that arm looked great. She is still having some pain in that arm, and has noticided when she gets her BP taken in that arm it normally is higher. Wants to know what her next step should be regarding this pain.   Will route to PCP for review.

## 2015-09-20 NOTE — Progress Notes (Signed)
Agree with note. Return to clinic in 1 month or sooner for MD check.

## 2015-09-23 ENCOUNTER — Telehealth: Payer: Self-pay

## 2015-09-23 NOTE — Telephone Encounter (Signed)
F/u with me to recheck and consider injections

## 2015-09-23 NOTE — Telephone Encounter (Signed)
Patient is still having right arm pain. She went to a Rheumatologist who told her she did not have RA and that arm looked great. She is still having some pain in that arm, and has noticided when she gets her BP taken in that arm it normally is higher. Wants to know what her next step should be regarding this pain. She would like to know what would be the next step.

## 2015-09-24 NOTE — Telephone Encounter (Signed)
Patient advised.

## 2015-10-01 ENCOUNTER — Ambulatory Visit (INDEPENDENT_AMBULATORY_CARE_PROVIDER_SITE_OTHER): Payer: 59

## 2015-10-01 ENCOUNTER — Encounter: Payer: Self-pay | Admitting: Family Medicine

## 2015-10-01 ENCOUNTER — Ambulatory Visit (INDEPENDENT_AMBULATORY_CARE_PROVIDER_SITE_OTHER): Payer: 59 | Admitting: Family Medicine

## 2015-10-01 VITALS — BP 140/78 | HR 105 | Wt 159.0 lb

## 2015-10-01 DIAGNOSIS — M79601 Pain in right arm: Secondary | ICD-10-CM

## 2015-10-01 DIAGNOSIS — R030 Elevated blood-pressure reading, without diagnosis of hypertension: Secondary | ICD-10-CM

## 2015-10-01 DIAGNOSIS — M542 Cervicalgia: Secondary | ICD-10-CM

## 2015-10-01 DIAGNOSIS — IMO0001 Reserved for inherently not codable concepts without codable children: Secondary | ICD-10-CM | POA: Insufficient documentation

## 2015-10-01 DIAGNOSIS — G8929 Other chronic pain: Secondary | ICD-10-CM | POA: Insufficient documentation

## 2015-10-01 NOTE — Patient Instructions (Addendum)
Thank you for coming in today. I am no sure what is wrong.  We are doing some tests to figure it out.  We will xray your neck and ultraosund the arm for venus and arterial issues.  Return following tests.   Call or go to the emergency room if you get worse, have trouble breathing, have chest pains, or palpitations.

## 2015-10-01 NOTE — Assessment & Plan Note (Signed)
Unclear etiology. Possible cervical radicular versus vascular. Plan to obtain x-rays as listed above and vascular studies as listed above. Recheck following study completion.

## 2015-10-01 NOTE — Progress Notes (Signed)
Mackenzie Jones is a 44 y.o. female who presents to [facility]  today for right arm pain. Patient has ongoing right-sided arm pain. The pain is located at the flexor elbow extends to the lateral upper arm. She notes diffuse soreness but denies any sharp pains weakness or numbness. The pain worsens with exertion and use. She denies any fevers chills nausea vomiting or diarrhea. She's had a workup for Jafar including labs which showed an elevated rheumatoid factor. Rheumatology did not suspect any rheumatologic process.   Past Medical History  Diagnosis Date  . 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 Ablation    . Wisdom tooth extraction  1996   Social History  Substance Use Topics  . Smoking status: Never Smoker   . Smokeless tobacco: Never Used  . Alcohol Use: No   family history includes Hypertension in her mother.  ROS as above Medications: Current Outpatient Prescriptions  Medication Sig Dispense Refill  . phentermine 37.5 MG capsule Take 1 capsule (37.5 mg total) by mouth every morning. (Patient not taking: Reported on 10/01/2015) 30 capsule 0   No current facility-administered medications for this visit.   Allergies  Allergen Reactions  . Flagyl [Metronidazole]   . Sulfa Antibiotics      Exam:  BP 140/78 mmHg  Pulse 105  Wt 159 lb (72.122 kg)  SpO2 100% Gen: Well NAD HEENT: EOMI,  MMM Lungs: Normal work of breathing. CTABL Heart: RRR no MRG Abd: NABS, Soft. Nondistended, Nontender Exts: Brisk capillary refill, warm and well perfused.  Right shoulder normal-appearing nontender full motion negative impingement testing normal strength Elbow normal-appearing nontender full motion. Nontender full motion pulses capillary refill sensation intact distally. Neck: Nontender to midline normal neck range of motion.  Pulses intact and equal wrists bilaterally  Limited  musculoskeletal ultrasound of the palmar right wrist. Carpal tunnel visualized. Median nerve is enlarged to 16 mm Sure she is otherwise normal. Anterior elbow visualized with no. Abnormalities. Point of maximal tenderness in the muscle belly of the right biceps visualized with no apparent abnormalities   X-ray no, humerus, forearm pending. Additionally arterial and venous ultrasound studies ordered  No results found for this or any previous visit (from the past 24 hour(s)). No results found.   Please see individual assessment and plan sections.

## 2015-10-01 NOTE — Assessment & Plan Note (Signed)
Recheck next visit

## 2015-10-02 NOTE — Progress Notes (Signed)
Quick Note:  Xray neck, and arm were normal. Will wait for ultrasound tests ., ______

## 2015-10-04 ENCOUNTER — Telehealth: Payer: Self-pay | Admitting: Family Medicine

## 2015-10-04 NOTE — Telephone Encounter (Signed)
I called and have Mackenzie Jones scheduled on 10/08/2015 at 1:30 in the [facility] office. I called Kaneshia and she is aware of the appointment - CF

## 2015-10-04 NOTE — Telephone Encounter (Signed)
Called pt and explained the referral process. Pt states that she had a difficult time with her referral to rheumatology and is concerned that the same thing may be happening again. Advised that I would look into how long it may take and give her a call back.

## 2015-10-04 NOTE — Telephone Encounter (Signed)
Pt called. She is following up on referral to have vasucular studies done. She also wants to call the [place] office herself to whomever we refer her to to have vascular studies done.  The best number to reach her at is [phone].  Thank you.

## 2015-10-08 ENCOUNTER — Ambulatory Visit (HOSPITAL_COMMUNITY)
Admission: RE | Admit: 2015-10-08 | Discharge: 2015-10-08 | Disposition: A | Discharge status: Home / Self Care | Payer: 59 | Source: Ambulatory Visit | Attending: Family Medicine | Admitting: Family Medicine

## 2015-10-08 DIAGNOSIS — M79601 Pain in right arm: Secondary | ICD-10-CM | POA: Diagnosis not present

## 2015-10-09 NOTE — Progress Notes (Signed)
Quick Note:  Arm ultrasound for blood clots is normal. Awaiting results from the ultrasound that looks at the arteries. ______

## 2015-10-10 ENCOUNTER — Other Ambulatory Visit: Payer: Self-pay | Admitting: Family Medicine

## 2015-10-10 DIAGNOSIS — M79601 Pain in right arm: Secondary | ICD-10-CM

## 2015-10-10 NOTE — Progress Notes (Signed)
VAS order was placed incorrectly, order corrected.

## 2015-10-11 ENCOUNTER — Ambulatory Visit (HOSPITAL_COMMUNITY)
Admission: RE | Admit: 2015-10-11 | Discharge: 2015-10-11 | Disposition: A | Discharge status: Home / Self Care | Payer: 59 | Source: Ambulatory Visit | Attending: Family Medicine | Admitting: Family Medicine

## 2015-10-11 ENCOUNTER — Other Ambulatory Visit (HOSPITAL_COMMUNITY): Payer: 59

## 2015-10-11 ENCOUNTER — Ambulatory Visit (HOSPITAL_BASED_OUTPATIENT_CLINIC_OR_DEPARTMENT_OTHER)
Admission: RE | Admit: 2015-10-11 | Discharge: 2015-10-11 | Disposition: A | Discharge status: Home / Self Care | Payer: 59 | Source: Ambulatory Visit | Attending: Family Medicine | Admitting: Family Medicine

## 2015-10-11 ENCOUNTER — Other Ambulatory Visit: Payer: Self-pay | Admitting: Family Medicine

## 2015-10-11 DIAGNOSIS — I739 Peripheral vascular disease, unspecified: Secondary | ICD-10-CM | POA: Diagnosis not present

## 2015-10-11 DIAGNOSIS — M79601 Pain in right arm: Secondary | ICD-10-CM | POA: Insufficient documentation

## 2015-10-11 NOTE — Progress Notes (Signed)
*  PRELIMINARY RESULTS* Vascular Ultrasound Upper Extremity Arterial Doppler has been completed.  There is evidence of mild waveform changes upon onset of symptoms with exertion.  Bilateral upper extremity waveforms and segmental pressures are within normal limits at rest, with arms at the side.  10/11/2015 12:51 PM Maudry Mayhew, RVT, RDCS, RDMS

## 2015-10-15 ENCOUNTER — Telehealth: Payer: Self-pay | Admitting: Family Medicine

## 2015-10-15 NOTE — Telephone Encounter (Signed)
Dr. Georgina Snell,      Ms Nevers called and stated she is waiting to hear back about Friday's appointment but she feels it went well. She stated she is still experiencing the same symptoms she has been and if you need to refer her somewhere else that would be great she stated that she doesn't like the feeling of waiting around and not knowing what is going on and if you could contact her on her cell at [phone] she would appreciate it. She did state that she feels after Friday's appointment she does feel like they may finally be getting somewhere.   Thank you Jenny Reichmann

## 2015-10-23 NOTE — Progress Notes (Signed)
Quick Note:  Normal upper extremity study. ______

## 2015-10-31 ENCOUNTER — Ambulatory Visit
Admission: RE | Admit: 2015-10-31 | Discharge: 2015-10-31 | Disposition: A | Discharge status: Home / Self Care | Payer: 59 | Source: Ambulatory Visit

## 2015-10-31 DIAGNOSIS — Z1231 Encounter for screening mammogram for malignant neoplasm of breast: Secondary | ICD-10-CM

## 2016-04-22 ENCOUNTER — Ambulatory Visit (INDEPENDENT_AMBULATORY_CARE_PROVIDER_SITE_OTHER): Payer: 59 | Admitting: Osteopathic Medicine

## 2016-04-22 ENCOUNTER — Encounter: Payer: Self-pay | Admitting: Osteopathic Medicine

## 2016-04-22 VITALS — BP 129/85 | HR 103 | Ht 60.0 in | Wt 175.0 lb

## 2016-04-22 DIAGNOSIS — Z0000 Encounter for general adult medical examination without abnormal findings: Secondary | ICD-10-CM

## 2016-04-22 DIAGNOSIS — Z23 Encounter for immunization: Secondary | ICD-10-CM

## 2016-04-22 DIAGNOSIS — E669 Obesity, unspecified: Secondary | ICD-10-CM

## 2016-04-22 LAB — COMPLETE METABOLIC PANEL WITH GFR
ALT: 15 U/L (ref 6–29)
AST: 14 U/L (ref 10–35)
Albumin: 4.2 g/dL (ref 3.6–5.1)
Alkaline Phosphatase: 90 U/L (ref 33–115)
BILIRUBIN TOTAL: 0.6 mg/dL (ref 0.2–1.2)
BUN: 12 mg/dL (ref 7–25)
CHLORIDE: 105 mmol/L (ref 98–110)
CO2: 23 mmol/L (ref 20–31)
CREATININE: 0.75 mg/dL (ref 0.50–1.10)
Calcium: 8.9 mg/dL (ref 8.6–10.2)
GFR, Est Non African American: >89 mL/min (ref >=60)
Glucose, Bld: 84 mg/dL (ref 65–99)
Potassium: 4 mmol/L (ref 3.5–5.3)
Sodium: 137 mmol/L (ref 135–146)
TOTAL PROTEIN: 6.9 g/dL (ref 6.1–8.1)

## 2016-04-22 LAB — LIPID PANEL
CHOL/HDL RATIO: 4.5 ratio (ref <=5.0)
Cholesterol: 254 mg/dL — ABNORMAL HIGH (ref 125–200)
HDL: 57 mg/dL (ref >=46)
LDL CALC: 178 mg/dL — AB (ref <130)
Triglycerides: 94 mg/dL (ref <150)
VLDL: 19 mg/dL (ref <30)

## 2016-04-22 LAB — CBC
HEMATOCRIT: 35.6 % (ref 35.0–45.0)
Hemoglobin: 12.1 g/dL (ref 11.7–15.5)
MCH: 29.2 pg (ref 27.0–33.0)
MCHC: 34 g/dL (ref 32.0–36.0)
MCV: 85.8 fL (ref 80.0–100.0)
MPV: 9.7 fL (ref 7.5–12.5)
PLATELETS: 316 10*3/uL (ref 140–400)
RBC: 4.15 MIL/uL (ref 3.80–5.10)
RDW: 13.5 % (ref 11.0–15.0)
WBC: 8.1 10*3/uL (ref 3.8–10.8)

## 2016-04-22 LAB — TSH: TSH: 2 m[IU]/L

## 2016-04-22 MED ORDER — PHENTERMINE HCL 37.5 MG PO CAPS: 37.5000 mg | ORAL_CAPSULE | ORAL | Status: DC

## 2016-04-22 NOTE — Patient Instructions (Signed)
Typically, I will have my patients plan to follow-up every 3-4 months for management/monitoring of any chronic medical issues or prescriptions. Please don't hesitate to make an appointment sooner if you're having any acute concerns or problems!  For my patients on Phentermine - I'm fine to continue this medicine short-term but it is not recommended for use beyond a few months. Please research the following medications (in terms of side effects, effectiveness, and coverage under your insurance plan) and in a few months we will talk about which one you would like to be on, if you would like to continue long-term medicines for weight management.  Qsymia (Phentermine and Topiramate)  Saxenda (Liraglutide 3 mg/day)  Contrave (Bupropion and Naltrexone)  Lorcaserin (Belviq or Belviq XR)  Orlistat (Xenical, Alli)  Bupropion (Wellbutrin)   At any visits to any of your specialists, please give them our clinic information so that they can forward [place] any records, including any tests which are done or changes to your medications. This allows all your physicians to communicate effectively, putting your primary care doctor at the center of your medical care and allowing [place] to effectively coordinate your care.   Let's plan to follow-up here in the office in 1 month for weight check with a nurse, and in 2 - 3 months for a visit with me to discuss weight management medications.   Please let us know if there is anything else we can do for you. Take care! -Dr. Loni Muse.

## 2016-04-22 NOTE — Progress Notes (Signed)
HPI: Mackenzie Jones is a 45 y.o. female who presents to [facility] today for chief complaint of:  Chief Complaint  Patient presents with  . Annual Exam     Patient is interested in switching to me as her primary care provider, here to establish care/annual physical/wellness exam, preventive care. See below. Otherwise patient feeling well today and no complaints. She is interested in getting back on phentermine for weight loss medication.   Past medical, social and family history reviewed: Past Medical History  Diagnosis Date  . 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 Ablation    . Wisdom tooth extraction  1996   Social History  Substance Use Topics  . Smoking status: Never Smoker   . Smokeless tobacco: Never Used  . Alcohol Use: No   Family History  Problem Relation Age of Onset  . Hypertension Mother     No current outpatient prescriptions on file.   No current facility-administered medications for this visit.   Allergies  Allergen Reactions  . Flagyl [Metronidazole]   . Sulfa Antibiotics       Review of Systems: CONSTITUTIONAL:  No  fever, no chills, No  unintentional weight changes HEAD/EYES/EARS/NOSE/THROAT: No  headache, no vision change, no hearing change, No  sore throat, No  sinus pressure CARDIAC: No  chest pain, No  pressure, No palpitations, No  orthopnea RESPIRATORY: No  cough, No  shortness of breath/wheeze GASTROINTESTINAL: No  nausea, No  vomiting, No  abdominal pain, No  blood in stool, No  diarrhea, No  constipation  MUSCULOSKELETAL: No  myalgia/arthralgia GENITOURINARY: No  incontinence, No  abnormal genital bleeding/discharge SKIN: No  rash/wounds/concerning lesions HEM/ONC: No  easy bruising/bleeding, No  abnormal lymph node ENDOCRINE: No polyuria/polydipsia/polyphagia, No  heat/cold intolerance  NEUROLOGIC: No  weakness, No  dizziness, No   slurred speech PSYCHIATRIC: No  concerns with depression, No  concerns with anxiety, No sleep problems  Exam:  BP 129/85 mmHg  Pulse 103  Ht 5' (1.524 m)  Wt 175 lb (79.379 kg)  BMI 34.18 kg/m2 Constitutional: VS see above. General Appearance: alert, well-developed, well-nourished, NAD Eyes: Normal lids and conjunctive, non-icteric sclera, PERRLA Ears, Nose, Mouth, Throat: MMM, Normal external inspection ears/nares/mouth/lips/gums, TM normal bilaterally. Pharynx no erythema, no exudate.  Neck: No masses, trachea midline. No thyroid enlargement/tenderness/mass appreciated. No lymphadenopathy Respiratory: Normal respiratory effort. no wheeze, no rhonchi, no rales Cardiovascular: S1/S2 normal, no murmur, no rub/gallop auscultated. RRR. No lower extremity edema. Gastrointestinal: Nontender, no masses. No hepatomegaly, no splenomegaly. No hernia appreciated. Bowel sounds normal. Rectal exam deferred.  Musculoskeletal: Gait normal. No clubbing/cyanosis of digits.  Neurological: No cranial nerve deficit on limited exam. Motor and sensation intact and symmetric Skin: warm, dry, intact. No rash/ulcer. No concerning nevi or subq nodules on limited exam.   Psychiatric: Normal judgment/insight. Normal mood and affect.    No results found for this or any previous visit (from the past 72 hour(s)).    ASSESSMENT/PLAN:  Annual physical exam - Plan: COMPLETE METABOLIC PANEL WITH GFR, VITAMIN D 25 Hydroxy (Vit-D Deficiency, Fractures), TSH, Lipid panel, CBC  Obesity (BMI 30.0-34.9) - Plan: phentermine 37.5 MG capsule  Need for diphtheria-tetanus-pertussis (Tdap) vaccine, adult/adolescent - Plan: Tdap vaccine greater than or equal to 7yo IM    FEMALE PREVENTIVE CARE  ANNUAL SCREENING/COUNSELING Tobacco - noNever  Alcohol - none Diet/Exercise - HEALTHY HABITS DISCUSSED TO DECREASE  CV RISK  Depression - PQH2 Negative Domestic violence concerns - no HTN SCREENING - SEE VITALS Vaccination status  - SEE BELOW  SEXUAL HEALTH Sexually active in the past year - yes With - Yes with female. STI - The patient denies history of sexually transmitted disease. STI testing today? - no   INFECTIOUS DISEASE SCREENING HIV - declines screening GC/CT - sexually active - does not need HepC - DOB [birth year] - does not need TB - does not need  DISEASE SCREENING Lipid - does not need DM2 - needs Osteoporosis - does not need  CANCER SCREENING Cervical - does not need Breast - does not need - mammogram BI-RADS 1 less than 1 year ago  Lung - does not need Colon - does not need  ADULT VACCINATION Influenza - already has Td - was given HPV - was not indicated Zoster - was not indicated Pneumonia - was not indicated  OTHER Fall - exercise and Vit D age 56+ - does not need Consider ASA - age 61-59 - does not need     All questions were answered. Visit summary with updated medication list and pertinent instructions was printed for patient. RTC precautions were reviewed with the patient. Return in about 4 weeks (around 05/20/2016) for nurse visit weight check, plan to follow with apointment w/ Dr. Loni Muse in 2 - 3 months to discuss meds.

## 2016-04-23 LAB — VITAMIN D 25 HYDROXY (VIT D DEFICIENCY, FRACTURES): Vit D, 25-Hydroxy: 18 ng/mL — ABNORMAL LOW (ref 30–100)

## 2016-05-20 ENCOUNTER — Ambulatory Visit: Payer: 59

## 2016-06-04 ENCOUNTER — Ambulatory Visit (INDEPENDENT_AMBULATORY_CARE_PROVIDER_SITE_OTHER): Payer: 59 | Admitting: Osteopathic Medicine

## 2016-06-04 VITALS — BP 136/79 | HR 89 | Wt 169.0 lb

## 2016-06-04 DIAGNOSIS — E669 Obesity, unspecified: Secondary | ICD-10-CM | POA: Diagnosis not present

## 2016-06-04 DIAGNOSIS — R635 Abnormal weight gain: Secondary | ICD-10-CM

## 2016-06-04 MED ORDER — PHENTERMINE HCL 37.5 MG PO CAPS: 37.5000 mg | ORAL_CAPSULE | ORAL | Status: DC

## 2016-06-04 NOTE — Progress Notes (Signed)
   Subjective:    Patient ID: Mackenzie Jones, female    DOB: 1971/02/07, 45 y.o.   MRN: [id number]  HPI Pt is in today for a blood pressure and weight check. Pt denies side effects related to medication and states that she is tolerating it well.   Review of Systems     Objective:   Physical Exam        Assessment & Plan:  Pt has lost weight. Blood pressure is in a normotensive range. Refill for phentermine will be faxed to pharmacy on file. Pt advised to follow up with nurse in 30 days.

## 2016-06-04 NOTE — Progress Notes (Signed)
Nurse notes reviewed, nothing to add.

## 2016-06-25 ENCOUNTER — Ambulatory Visit (INDEPENDENT_AMBULATORY_CARE_PROVIDER_SITE_OTHER): Payer: 59 | Admitting: Osteopathic Medicine

## 2016-06-25 ENCOUNTER — Encounter: Payer: Self-pay | Admitting: Osteopathic Medicine

## 2016-06-25 VITALS — BP 145/88 | HR 75 | Ht 60.0 in | Wt 167.0 lb

## 2016-06-25 DIAGNOSIS — M79662 Pain in left lower leg: Secondary | ICD-10-CM

## 2016-06-25 DIAGNOSIS — S86812A Strain of other muscle(s) and tendon(s) at lower leg level, left leg, initial encounter: Secondary | ICD-10-CM | POA: Diagnosis not present

## 2016-06-25 DIAGNOSIS — S86819A Strain of other muscle(s) and tendon(s) at lower leg level, unspecified leg, initial encounter: Secondary | ICD-10-CM | POA: Insufficient documentation

## 2016-06-25 NOTE — Progress Notes (Signed)
HPI: Mackenzie Jones is a 45 y.o. Not Hispanic or Latino female  who presents to [facility] today, 06/25/16,  for chief complaint of:  Chief Complaint  Patient presents with  . Leg Pain    LEFT BACK CALF    Left leg pain . Context: No injury or strenuous exercise . Location: back of L calf . Quality: feels like lump in the muscle when she is walking  . Severity: . Duration: 1 month  . Timing: Intermittent, doesn't seem to correspond to any particular point in her stride, no particular time of day, no particular activity. . Modifying factors: Has not tried any medications or therapies/exercises . Assoc signs/symptoms: No cold extremity, no swelling     Past medical, surgical, social and family history reviewed: Past Medical History:  Diagnosis Date  . 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 ABLATION    . [place] EXTRACTION  1996   Social History  Substance Use Topics  . Smoking status: Never Smoker  . Smokeless tobacco: Never Used  . Alcohol use No   Family History  Problem Relation Age of Onset  . Hypertension Mother      Current medication list and allergy/intolerance information reviewed:   Current Outpatient Prescriptions  Medication Sig Dispense Refill  . phentermine 37.5 MG capsule Take 1 capsule (37.5 mg total) by mouth every morning. 30 capsule 0   No current facility-administered medications for this visit.    Allergies  Allergen Reactions  . Flagyl [Metronidazole]   . Sulfa Antibiotics       Review of Systems:  Constitutional:  No  fever, no chills, No recent illness  Cardiac: No  chest pain, no claudication  Musculoskeletal: (+) new myalgia/arthralgia as per HPI  Skin: No  Rash, No other wounds/concerning lesions   Neurologic: No  weakness, no numbness  Exam:  BP (!) 145/88   Pulse 75   Ht 5' (1.524 m)   Wt 167 lb (75.8 kg)    BMI 32.61 kg/m   Constitutional: VS see above. General Appearance: alert, well-developed, well-nourished, NAD  Cardiovascular:No lower extremity edema. Pedal pulse II/IV bilaterally DP and PT  Musculoskeletal: Gait normal. No clubbing/cyanosis of digits. Homans sign negative on the left, no palpable muscle mass or spasm  Neurological: Motor and sensation intact and symmetric.  Skin: warm, dry, intact. No rash/ulcer.    ASSESSMENT/PLAN:   After evaluating patient as above, I sought brief informal consultation with sports medicine physician Dr. Tommi Rumps, since this muscle pain seemed most likely related to strain however I was interested to know if any imaging such as office ultrasound or x-ray might be warranted, versus trial PT or other intervention. His recommendation was at this time but could consider x-ray, trial physical therapy, calf compression device, return for further evaluation with sports med if no better with conservative measures. As I was going over the recommended plan with the patient, she said that she would prefer to see somebody as soon as possible, her pain had gotten significantly bothersome and she would prefer to proceed directly to consultation. Sought second opinion with Dr. Dianah Field who was immediately available for formal consultation. He saw and evaluated the patient as well today, please see his notes for full details, his assistance was greatly appreciated!  Calf pain, left - Likely gastrocnemius strain, patient requests consultation with sports medicine, appreciate the assistance of Dr. Darene Lamer  Strain of calf muscle, left, initial encounter     Visit summary with medication list and pertinent instructions was printed for patient to review. All questions at time of visit were answered - patient instructed to contact office with any additional concerns. ER/RTC precautions were reviewed with the patient. Follow-up plan: Return sooner if needed, for sports med followup  if no improvement per recommendations from Dr. Darene Lamer. .

## 2016-06-25 NOTE — Assessment & Plan Note (Signed)
Strapped with compressive dressing, heel lifts, rehabilitation exercises.  Return to see me in 3 weeks, we will consider imaging if no better.

## 2016-06-25 NOTE — Progress Notes (Signed)
   Subjective:    I'm seeing this patient as a consultation for:  Dr. Emeterio Reeve  CC: Left calf pain  HPI: This is a pleasant 45 year old female, for the past several weeks she's had pain that she localizes at the musculotendinous junction of the medial head of the gastrocnemius. No injuries, no change in physical activity, no leg swelling. Symptoms are mild, persistent.  Past medical history, Surgical history, Family history not pertinant except as noted below, Social history, Allergies, and medications have been entered into the medical record, reviewed, and no changes needed.   Review of Systems: No headache, visual changes, nausea, vomiting, diarrhea, constipation, dizziness, abdominal pain, skin rash, fevers, chills, night sweats, weight loss, swollen lymph nodes, body aches, joint swelling, muscle aches, chest pain, shortness of breath, mood changes, visual or auditory hallucinations.   Objective:   General: Well Developed, well nourished, and in no acute distress.  Neuro/Psych: Alert and oriented x3, extra-ocular muscles intact, able to move all 4 extremities, sensation grossly intact. Skin: Warm and dry, no rashes noted.  Respiratory: Not using accessory muscles, speaking in full sentences, trachea midline.  Cardiovascular: Pulses palpable, no extremity edema. Abdomen: Does not appear distended. Left Calf/Ankle: No visible erythema or swelling. Range of motion is full in all directions. Strength is 5/5 in all directions. Stable lateral and medial ligaments; squeeze test and kleiger test unremarkable; Talar dome nontender; No pain at base of 5th MT; No tenderness over cuboid; No tenderness over N spot or navicular prominence No tenderness on posterior aspects of lateral and medial malleolus No sign of peroneal tendon subluxations; Negative tarsal tunnel tinel's Able to walk 4 steps. Minimal tenderness at the musculotendinous junction of the medial head of the  gastrocnemius. Negative Homans sign.  Impression and Recommendations:   This case required medical decision making of moderate complexity.

## 2016-07-06 ENCOUNTER — Ambulatory Visit: Payer: 59

## 2016-07-13 ENCOUNTER — Ambulatory Visit: Payer: 59 | Admitting: Sports Medicine

## 2016-07-23 ENCOUNTER — Ambulatory Visit: Payer: 59 | Admitting: Osteopathic Medicine

## 2016-09-17 ENCOUNTER — Other Ambulatory Visit: Payer: Self-pay | Admitting: Obstetrics and Gynecology

## 2016-09-17 DIAGNOSIS — Z1231 Encounter for screening mammogram for malignant neoplasm of breast: Secondary | ICD-10-CM

## 2016-11-02 ENCOUNTER — Ambulatory Visit
Admission: RE | Admit: 2016-11-02 | Discharge: 2016-11-02 | Disposition: A | Discharge status: Home / Self Care | Payer: 59 | Source: Ambulatory Visit | Attending: Obstetrics and Gynecology | Admitting: Obstetrics and Gynecology

## 2016-11-02 DIAGNOSIS — Z1231 Encounter for screening mammogram for malignant neoplasm of breast: Secondary | ICD-10-CM

## 2016-12-01 DIAGNOSIS — Z01411 Encounter for gynecological examination (general) (routine) with abnormal findings: Secondary | ICD-10-CM | POA: Diagnosis not present

## 2016-12-01 DIAGNOSIS — Z124 Encounter for screening for malignant neoplasm of cervix: Secondary | ICD-10-CM | POA: Diagnosis not present

## 2016-12-22 ENCOUNTER — Other Ambulatory Visit: Payer: Self-pay | Admitting: Osteopathic Medicine

## 2016-12-22 DIAGNOSIS — R635 Abnormal weight gain: Secondary | ICD-10-CM

## 2017-01-15 IMAGING — CR DG HUMERUS 2V *R*
2 series · 2 of 2 positions shown · non-contrast
Comparison: None

CLINICAL DATA: RIGHT arm pain, RIGHT side neck pain radiating down
RIGHT arm for 1 month, no injury

EXAM:
RIGHT HUMERUS - 2+ VIEW

[humerus ap]
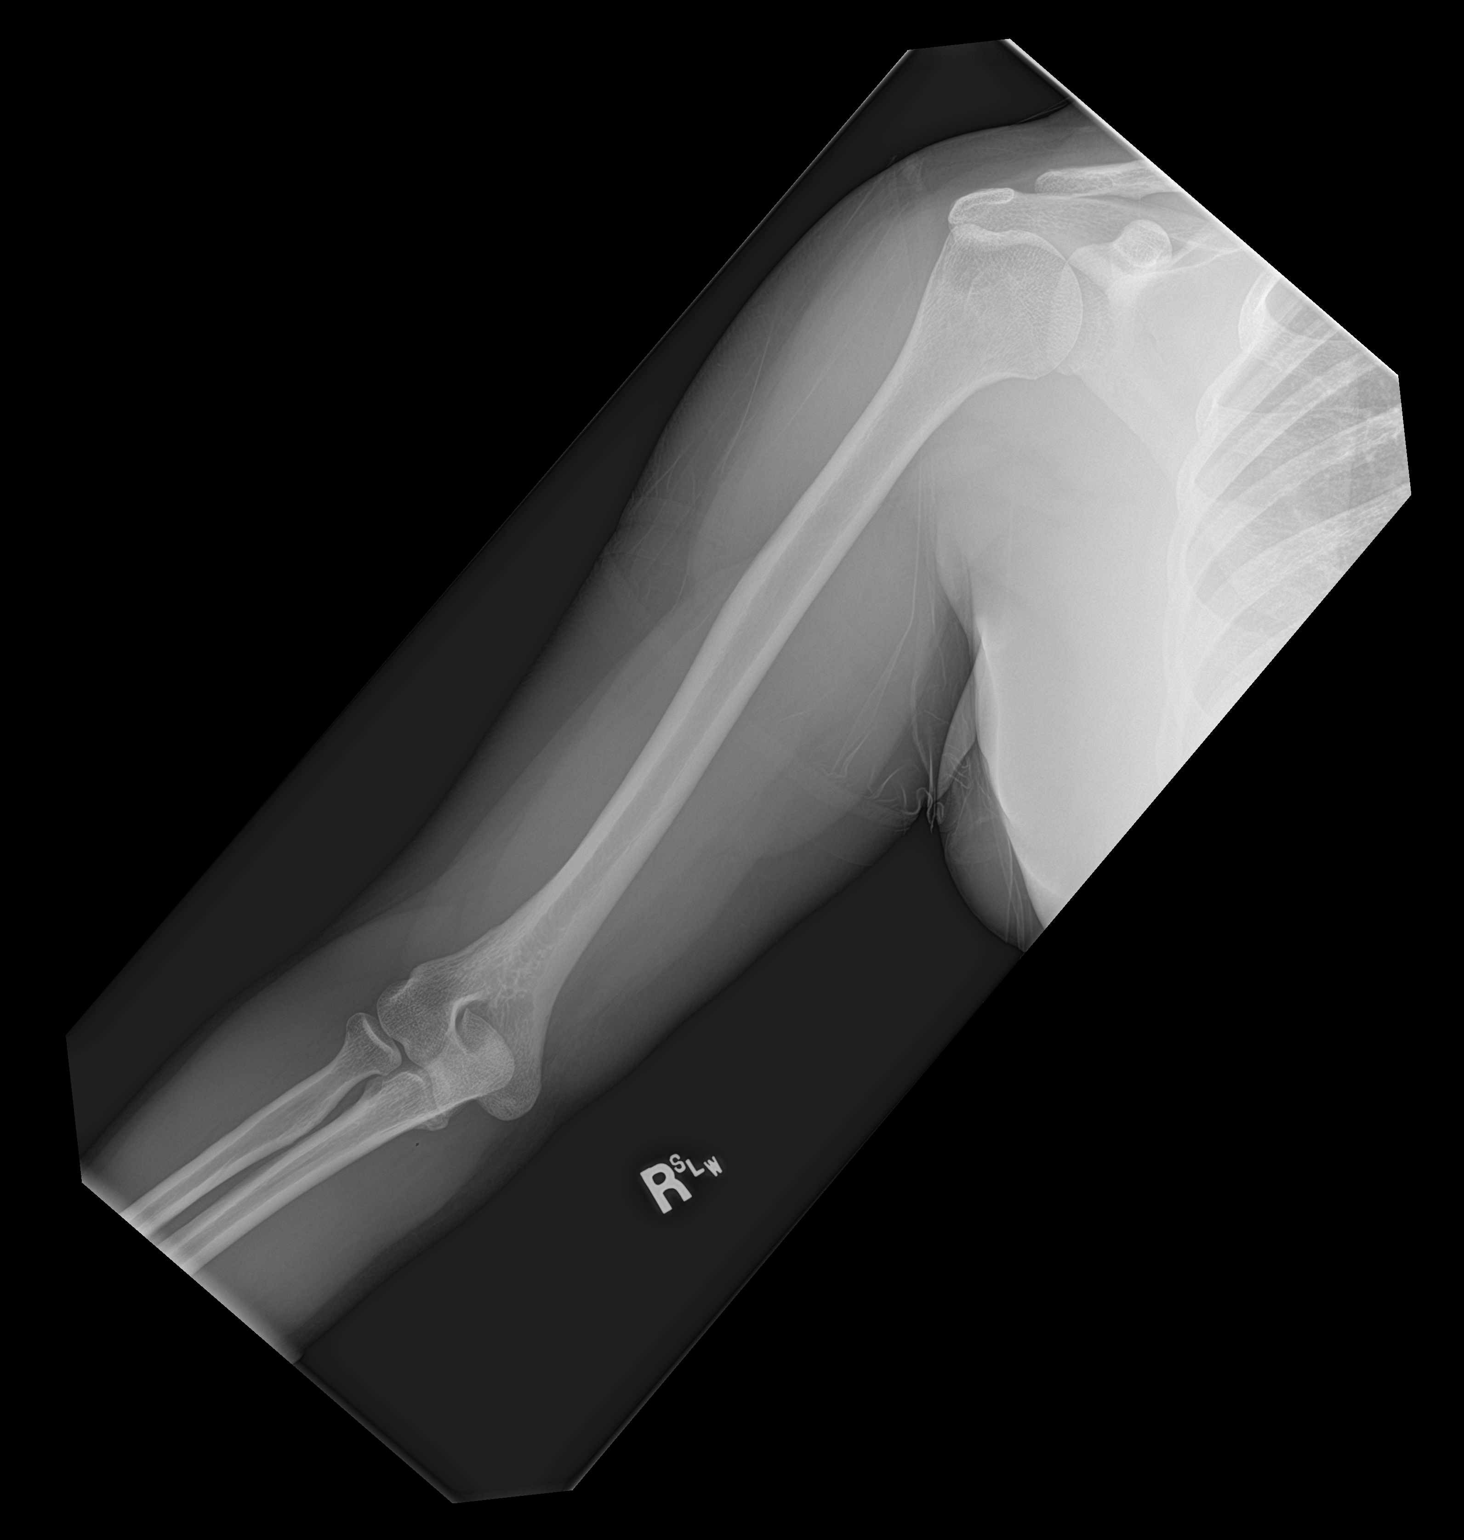

[humerus lat]
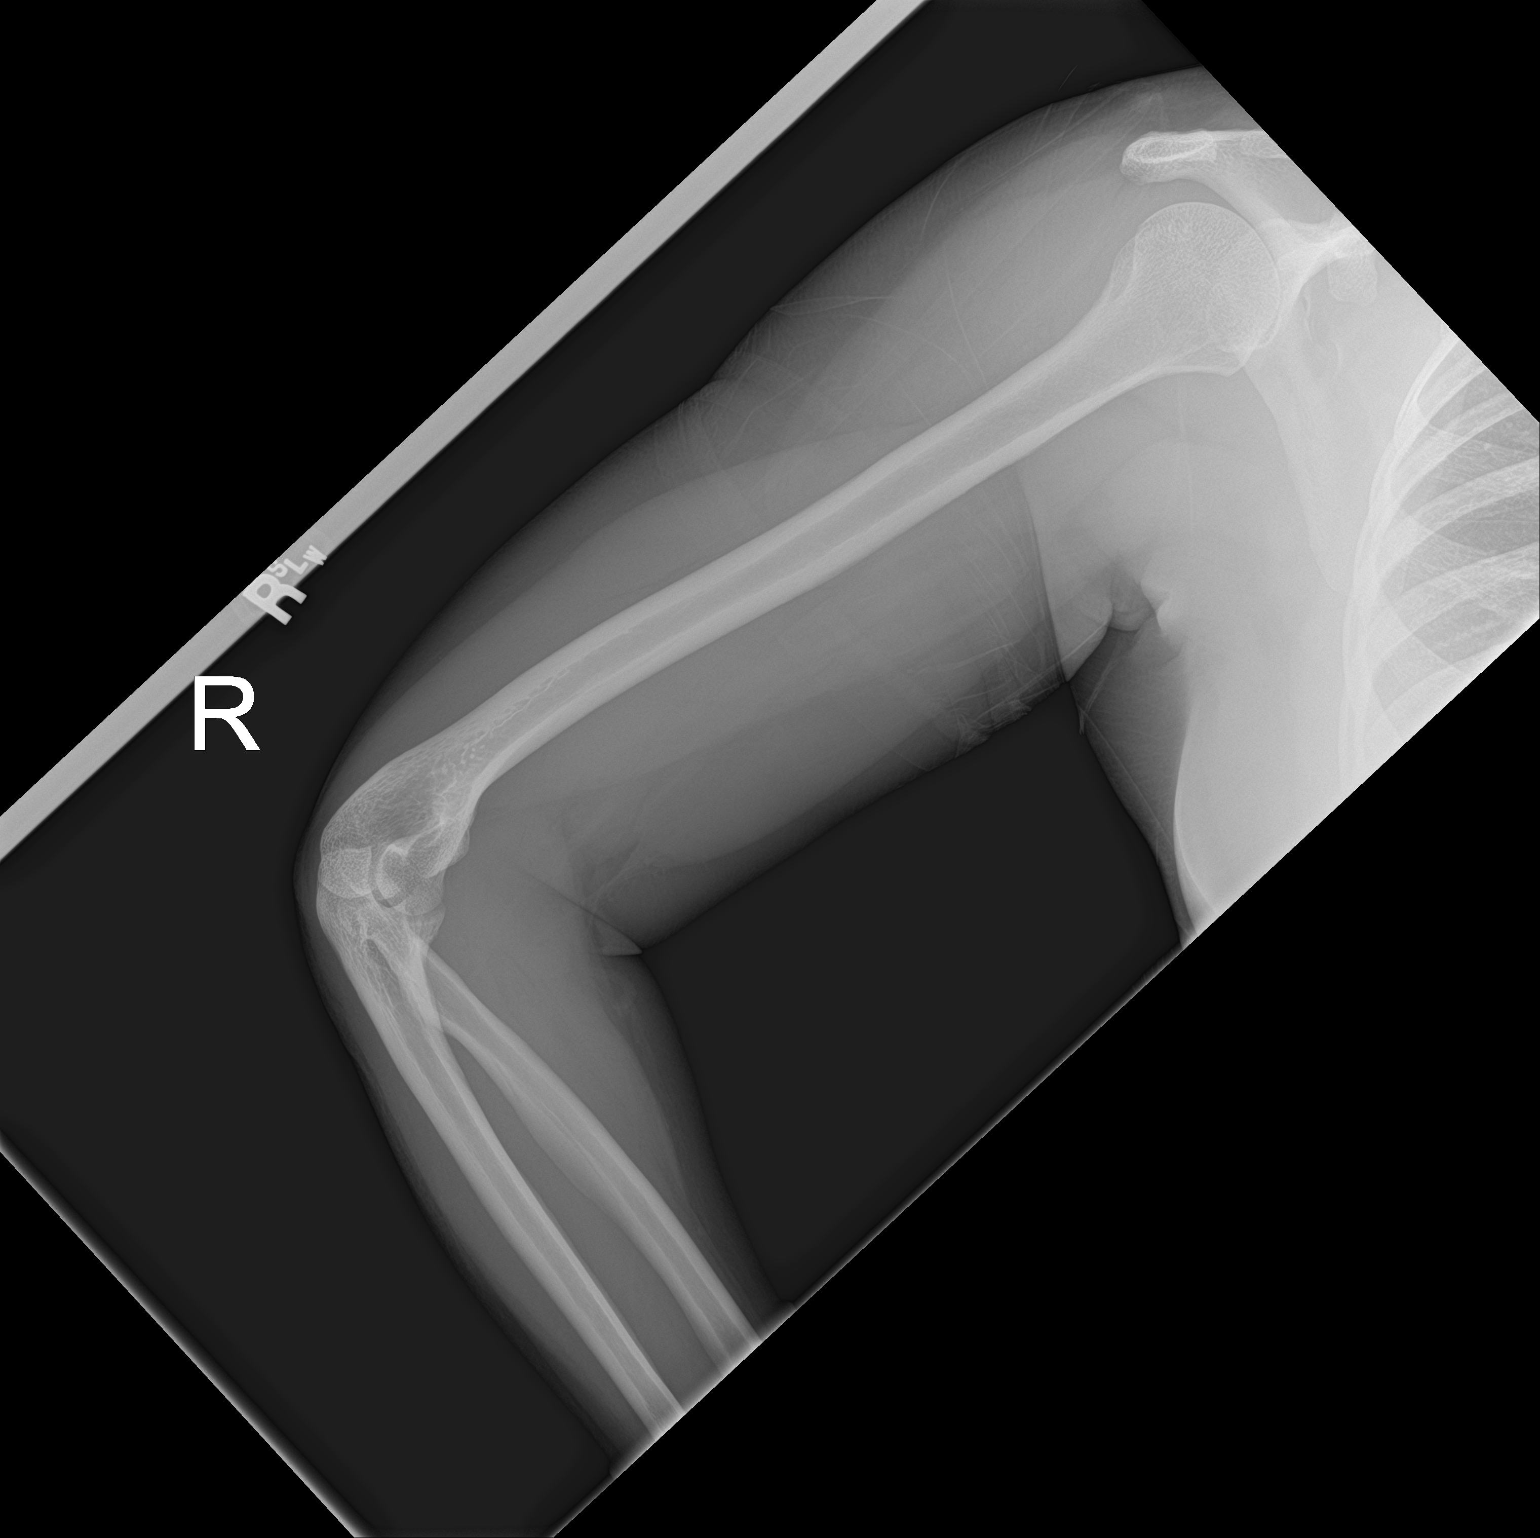

[2 of 2 positions shown; findings below may reference images not displayed]

FINDINGS: Osseous mineralization normal.

Joint spaces preserved.

No fracture, dislocation, or bone destruction.
IMPRESSION: Normal exam.

## 2017-01-15 IMAGING — CR DG FOREARM 2V*R*
2 series · 2 of 2 positions shown · non-contrast
Comparison: None in PACs

CLINICAL DATA: Right-sided neck pain radiating into the right arm
for the past month without known injury.

EXAM:
RIGHT FOREARM - 2 VIEW

[forearm ap]
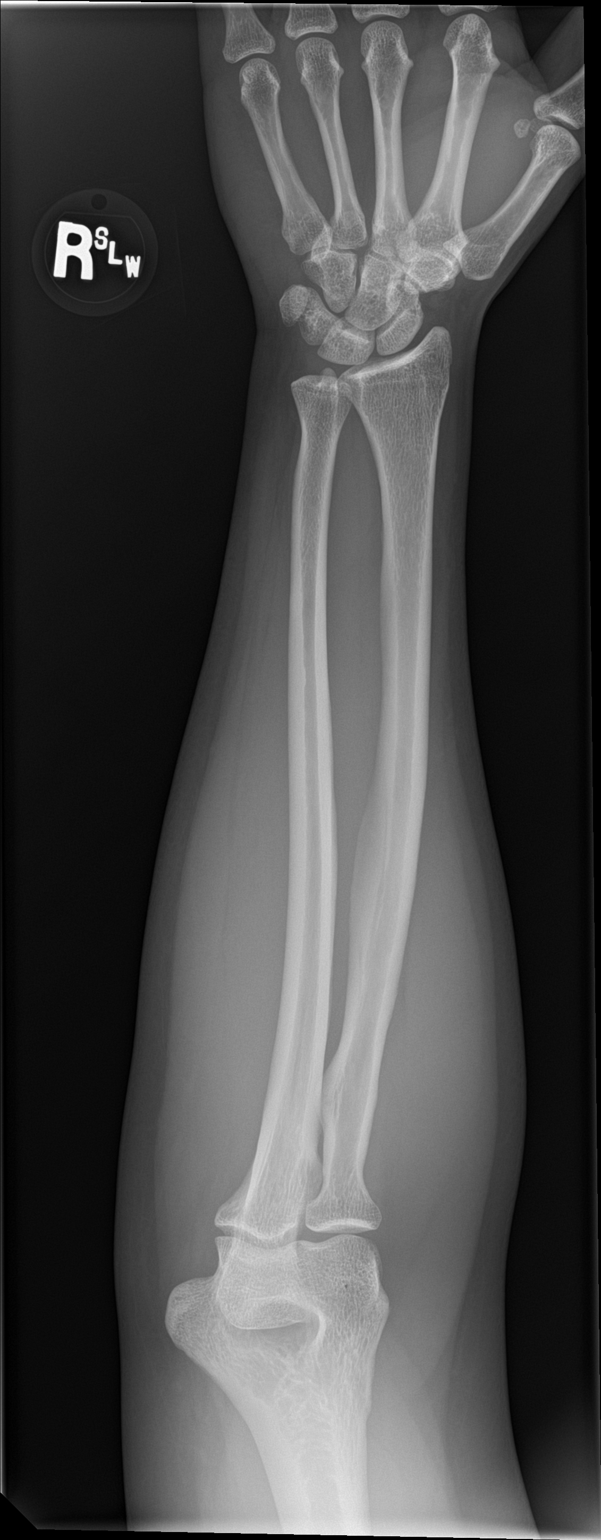

[forearm lat]
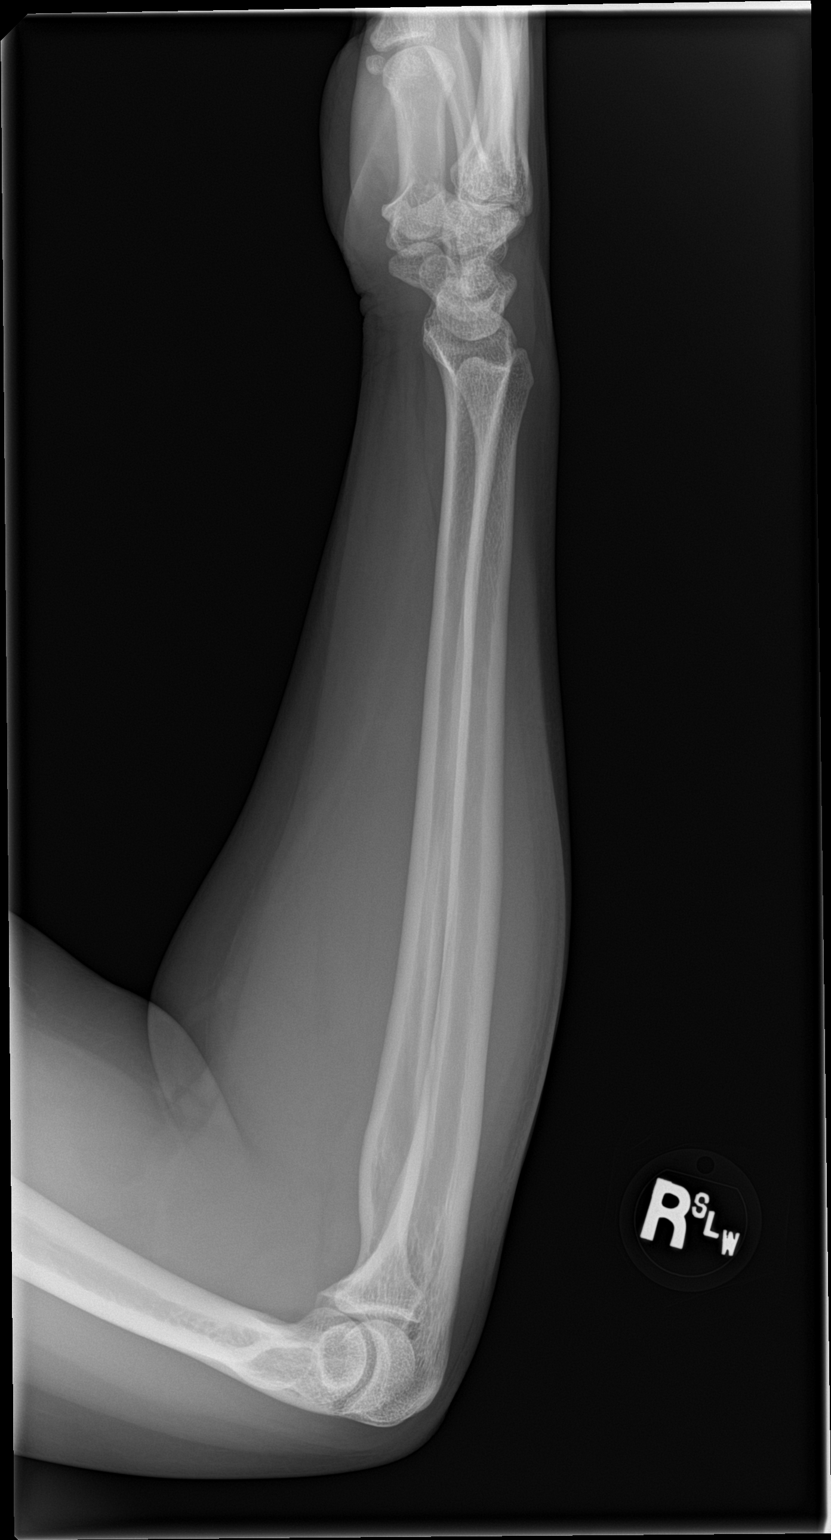

[2 of 2 positions shown; findings below may reference images not displayed]

FINDINGS: The right radius and ulna are adequately mineralized. There is no
lytic or blastic lesion or periosteal reaction. The observed
portions of the elbow and wrist exhibit no acute abnormalities. The
soft tissues are unremarkable.
IMPRESSION: There is no acute or chronic bony abnormality of the right radius or
ulna.

## 2017-01-15 IMAGING — CR DG CERVICAL SPINE COMPLETE 4+V
5 series · 5 of 5 positions shown · non-contrast
Comparison: None in PACs

CLINICAL DATA: Right-sided neck pain radiating into the right arm
for the past month, no known injury, history of hypertension and
dyslipidemia, nonsmoker.

EXAM:
CERVICAL SPINE - COMPLETE 4+ VIEW

[c-spine lat]
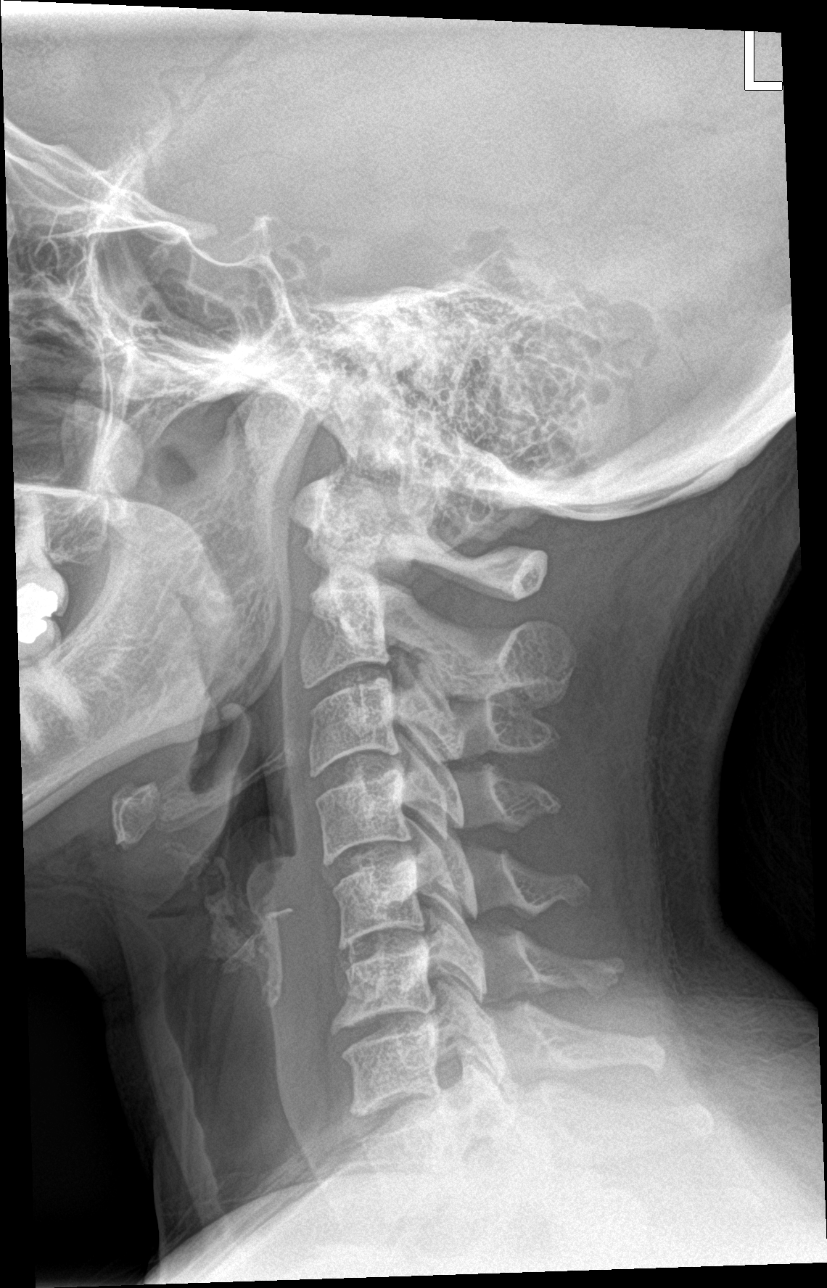

[c-spine obl (1 of 2)]
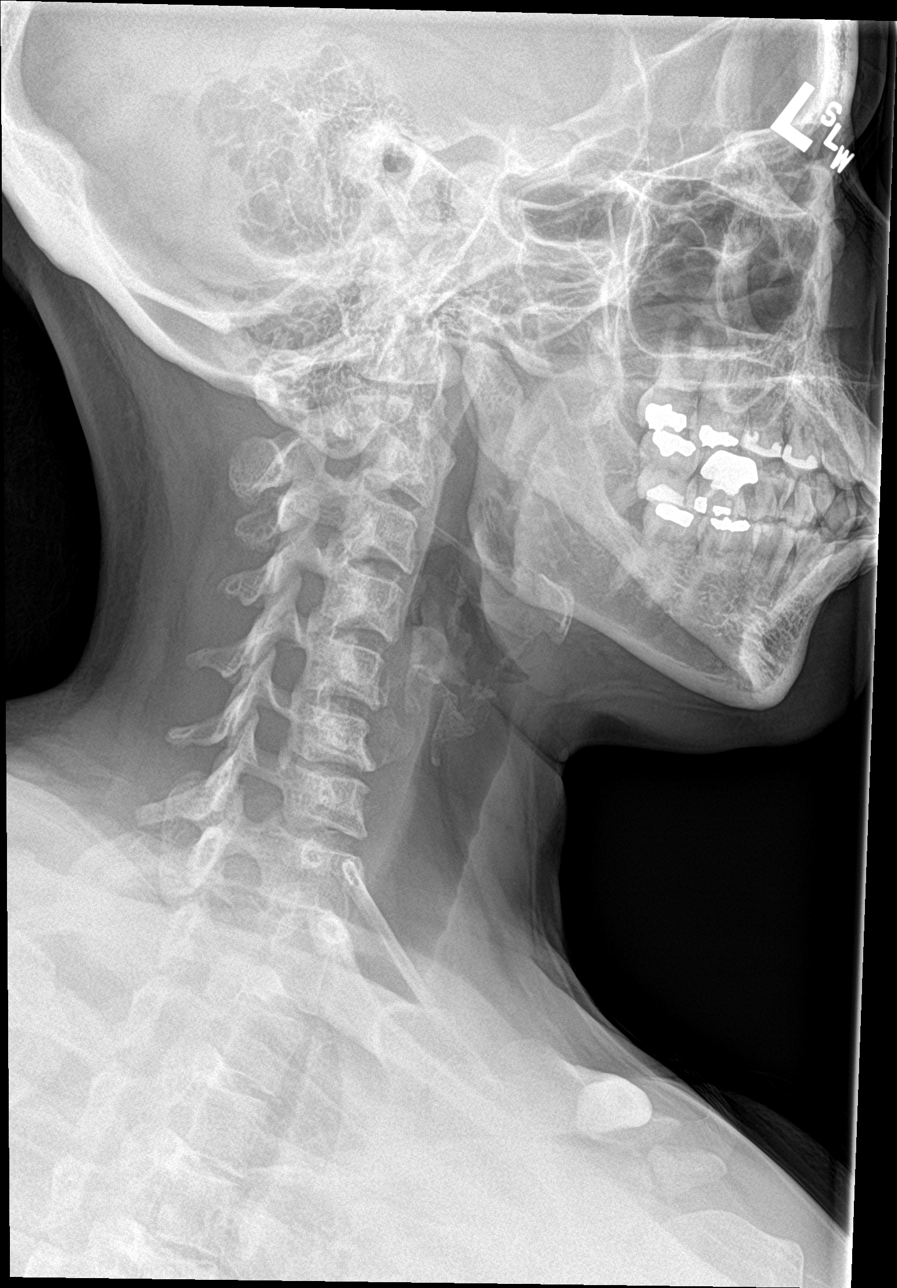

[c-spine obl (2 of 2)]
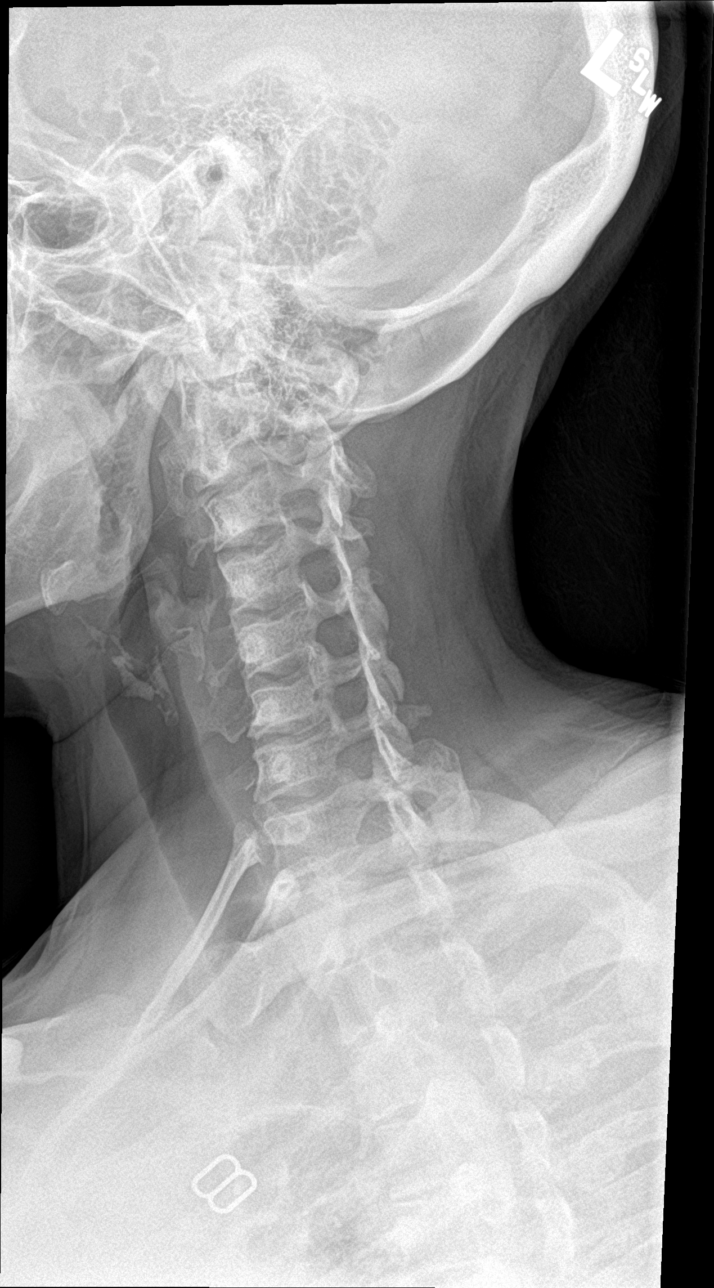

[c-spine ap]
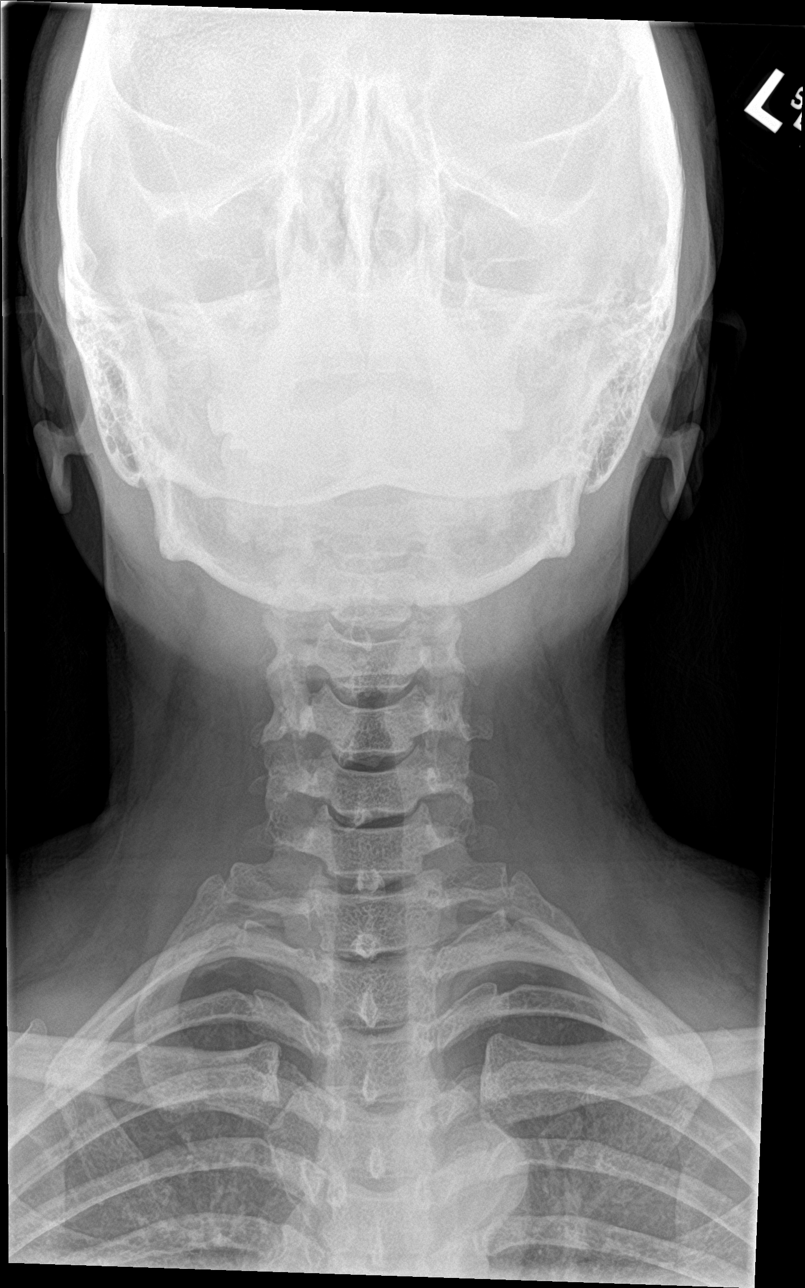

[c-spine open mouth]
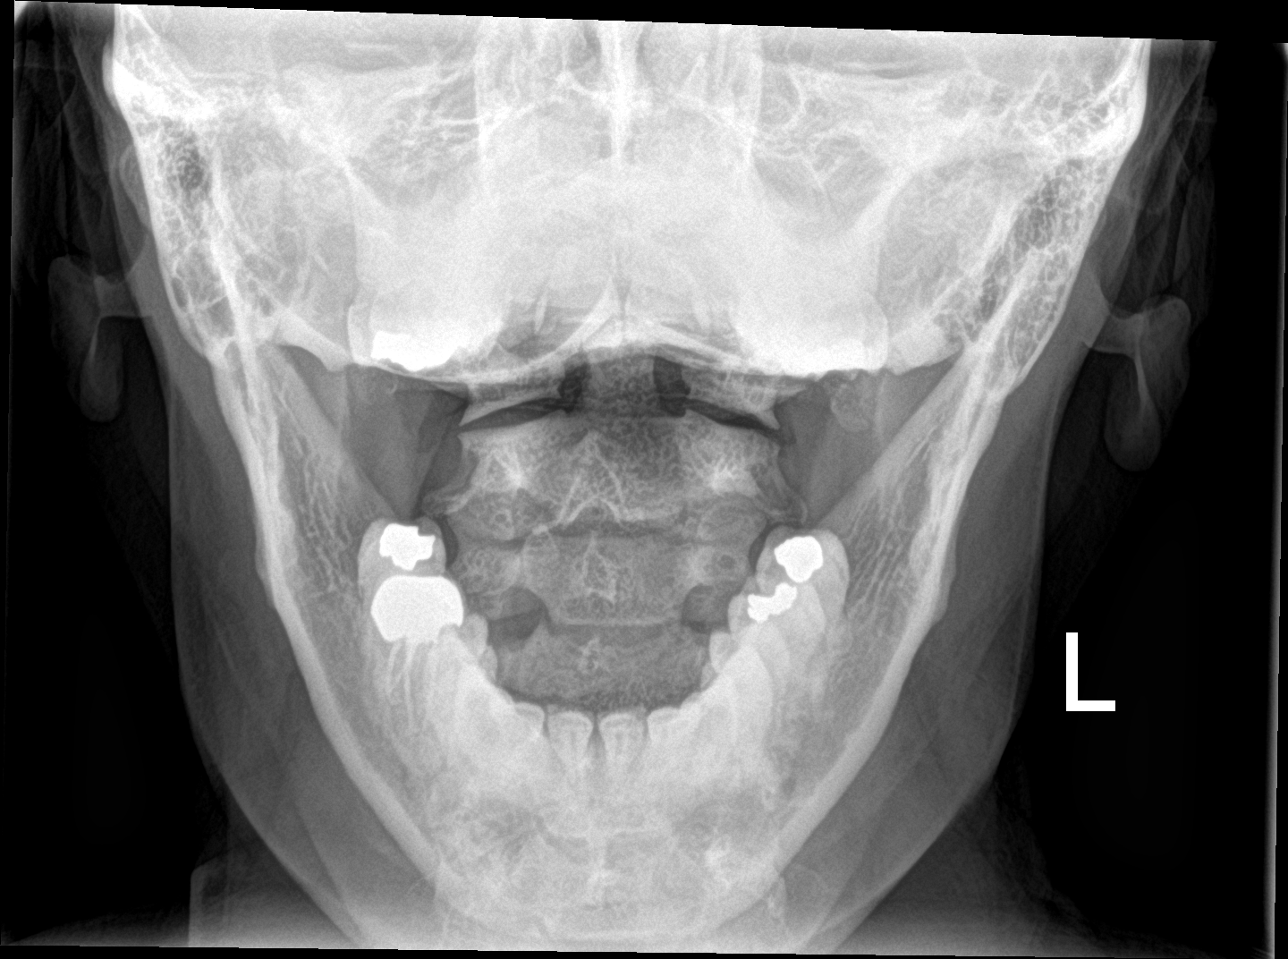

[5 of 5 positions shown; findings below may reference images not displayed]

FINDINGS: The cervical vertebral bodies are preserved in height. The disc
space heights are well maintained. There is a small anterior
endplate osteophyte at C6. There is no perched facet. The spinous
processes are intact. The oblique views reveal no bony encroachment
upon the neural foramina. The odontoid is intact. There are no
abnormal calcifications in region of the carotid arteries in the
neck.
IMPRESSION: There is no acute or significant chronic bony abnormality of the
cervical spine.

## 2017-01-18 ENCOUNTER — Encounter: Payer: Self-pay | Admitting: Family Medicine

## 2017-01-18 ENCOUNTER — Ambulatory Visit (INDEPENDENT_AMBULATORY_CARE_PROVIDER_SITE_OTHER): Payer: 59 | Admitting: Family Medicine

## 2017-01-18 VITALS — BP 132/86 | HR 93 | Temp 98.5°F | Resp 20 | Ht 60.0 in | Wt 178.5 lb

## 2017-01-18 DIAGNOSIS — E559 Vitamin D deficiency, unspecified: Secondary | ICD-10-CM | POA: Insufficient documentation

## 2017-01-18 DIAGNOSIS — Z6834 Body mass index (BMI) 34.0-34.9, adult: Secondary | ICD-10-CM

## 2017-01-18 DIAGNOSIS — E785 Hyperlipidemia, unspecified: Secondary | ICD-10-CM

## 2017-01-18 DIAGNOSIS — Z8619 Personal history of other infectious and parasitic diseases: Secondary | ICD-10-CM | POA: Diagnosis not present

## 2017-01-18 DIAGNOSIS — Z7689 Persons encountering health services in other specified circumstances: Secondary | ICD-10-CM

## 2017-01-18 NOTE — Progress Notes (Signed)
Patient ID: Mackenzie Jones, female  DOB: 09-Dec-1970, 46 y.o.   MRN: [id number] Patient Care Team    Relationship Specialty Notifications Start End  Ma Hillock, DO PCP - General Family Medicine  01/18/17    Comment: patient request transfer to [facility]  Ena Dawley, MD Consulting Physician Obstetrics and Gynecology  01/18/17   Nat Christen, MD Attending Physician Optometry  01/18/17     Subjective:  Mackenzie Jones is a 46 y.o.  female present for new patient establishment. All past medical history, surgical history, allergies, family history, immunizations, medications and social history were obtained and entered in the electronic medical record today. All recent labs, ED visits and hospitalizations within the last year were reviewed.  Her last physical was in Apr 22, 2016 with her prior PCP. She would like to restart a weight loss medication. She does not exercise routinely.   Vit D deficiency: pt had a low vit d in May last year. She is not rotinely taking a vitamin D supplement.  Obesity: Body mass index is 34.86 kg/m. Pt has been on phentermine in the past. She states it has helped to kick off her weight loss regimen. She is not exercising routinely. She is not currenlty focused on her diet.  Dyslipidemia: review of labs in May 2017 resutled with elevated LDL and total cholesterol. She has not changed her diet or exercise regimen.  HSV2: Pt reports she uses valtrex as needed for outbreak only and has about 2 outbreaks a year.   Health maintenance:  Colonoscopy: No Fhx, screen at 50. Mammogram: completed: 10/2016, [facility] Cervical cancer screening: last pap: 2016 Immunizations: tdap 2017 UTD, Influenza 08/2016 (encouraged yearly) Infectious disease screening: HIV declined    Immunization History  Administered Date(s) Administered  . Tdap 04/22/2016     Past Medical History:  Diagnosis Date  . Allergy   . 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 Flagyl [Metronidazole]   . Sulfa Antibiotics    Past Surgical History:  Procedure Laterality Date  . ABLATION    . WISDOM TOOTH EXTRACTION  1996   Family History  Problem Relation Age of Onset  . Hypertension Mother   . Arthritis Paternal Grandmother    Social History   Social History  . Marital status: Married    Spouse name: brian  . Number of children: 2  . Years of education: Masters   Occupational History  . engineer    Social History Main Topics  . Smoking status: Never Smoker  . Smokeless tobacco: Never Used  . Alcohol use No  . Drug use: No  . Sexual activity: Yes    Partners: Male    Birth control/ protection: Surgical     Comment: vas   Other Topics Concern  . Not on file   Social History Narrative   Married to [name]. They have 2 daughters Junie Panning and Industry).   Master's in Engineer, production. Works full time.   Drinks caffeine, takes a daily vitamin.    Exercises routinely.   Smoke detector in the home.   Firearms locked in the home.    Feels safe in her relationships.       Allergies as of 01/18/2017      Reactions   Flagyl [metronidazole]    Sulfa Antibiotics       Medication List       Accurate as of 01/18/17  2:19 PM. Always use your most recent med list.          fluticasone 50 MCG/ACT nasal spray Commonly known as:  FLONASE Place into both nostrils daily as needed for allergies or rhinitis.   valACYclovir 500 MG tablet Commonly known as:  VALTREX        No results found for this or any previous visit (from the past 2160 hour(s)).  Mm Digital Screening Bilateral  Result Date: 11/02/2016 CLINICAL DATA:  Screening. EXAM: DIGITAL SCREENING BILATERAL MAMMOGRAM WITH CAD COMPARISON:  Previous exam(s). ACR Breast Density Category b: There are scattered areas of fibroglandular density. FINDINGS: There are no findings suspicious for malignancy. Images were processed with CAD. IMPRESSION: No mammographic  evidence of malignancy. A result letter of this screening mammogram will be mailed directly to the patient. RECOMMENDATION: Screening mammogram in one year. (Code:SM-B-01Y) BI-RADS CATEGORY  1: Negative. Electronically Signed   By: Margarette Canada M.D.   On: 11/03/2016 08:24     ROS: 14 pt review of systems performed and negative (unless mentioned in an HPI)  Objective: BP 132/86 (BP Location: Right Arm, Patient Position: Sitting, Cuff Size: Large)   Pulse 93   Temp 98.5 F (36.9 C)   Resp 20   Ht 5' (1.524 m)   Wt 178 lb 8 oz (81 kg)   SpO2 98%   BMI 34.86 kg/m  Gen: Afebrile. No acute distress. Nontoxic in appearance, well-developed, well-nourished,  Pleasant obese female.  HENT: AT. [place]. MMM Eyes:Pupils Equal Round Reactive to light, Extraocular movements intact,  Conjunctiva without redness, discharge or icterus. Neck/lymp/endocrine: Supple,no  lymphadenopathy, no thyromegaly CV: RRR, no edema Chest: CTAB, no wheeze, rhonchi or crackles.  Abd: Soft. obese. NTND. BS present. Skin: no rashes, purpura or petechiae.  Neuro/Msk:  Normal gait. PERLA. EOMi. Alert. Oriented x3.   Psych: Normal affect, dress and demeanor. Normal speech. Normal thought content and judgment.   Assessment/plan: Mackenzie Jones is a 46 y.o. female present for establishment of care HSV2 - valtrex as needed for active lesion. Currently does not need medication.  BMI 34.0-34.9,adult/Dyslipidemia - dicussed diet and exercise modifications briefly. > 150 minutes of exercise a week.  - she has borderline BP readings.  - discussed the lack of evidence of phentermine long term and its effectiveness.  - would like to see her referred to Dr. Leafy Ro, will look into a referral for her.   Vitamin D deficiency Encouraged pt to take an OTC 800u-1000u daily with a meal.  Will repeat vit d at CPE  CPE due after 04/23/2017  Return in about 3 months (around 04/26/2017) for CPE.  Electronically signed by: Howard Pouch,  DO [facility]

## 2017-01-18 NOTE — Patient Instructions (Addendum)
It was a pleasure meeting you today. Try to take 800 - 1000 u vit d a day.  If you tolerate it, take 1g fish oil a day.  Diet and exercise (150 minutes a week). I will refer you to Dr. Shary Decamp to discuss weight loss regimen.      Please help [place] help you:  We are honored you have chosen [facility] for your Primary Care home. Below you will find basic instructions that you may need to access in the future. Please help [place] help you by reading the instructions, which cover many of the frequent questions we experience.   Prescription refills and request:  -In order to allow more efficient response time, please call your pharmacy for all refills. They will forward the request electronically to [place]. This allows for the quickest possible response. Request left on a nurse line can take longer to refill, since these are checked as time allows between office patients and other phone calls.  - refill request can take up to 3-5 working days to complete.  - If request is sent electronically and request is appropiate, it is usually completed in 1-2 business days.  - all patients will need to be seen routinely for all chronic medical conditions requiring prescription medications (see follow-up below). If you are overdue for follow up on your condition, you will be asked to make an appointment and we will call in enough medication to cover you until your appointment (up to 30 days).  - all controlled substances will require a face to face visit to request/refill.  - if you desire your prescriptions to go through a new pharmacy, and have an active script at original pharmacy, you will need to call your pharmacy and have scripts transferred to new pharmacy. This is completed between the pharmacy locations and not by your provider.    Results: If any images or labs were ordered, it can take up to 1 week to get results depending on the test ordered and the lab/facility running and resulting the test. - Normal  or stable results, which do not need further discussion, will be released to your mychart immediately with attached note to you. A call will not be generated for normal results. Please make certain to sign up for mychart. If you have questions on how to activate your mychart you can call the front office.  - If your results need further discussion, our office will attempt to contact you via phone, and if unable to reach you after 2 attempts, we will release your abnormal result to your mychart with instructions.  - All results will be automatically released in mychart after 1 week.  - Your provider will provide you with explanation and instruction on all relevant material in your results. Please keep in mind, results and labs may appear confusing or abnormal to the untrained eye, but it does not mean they are actually abnormal for you personally. If you have any questions about your results that are not covered, or you desire more detailed explanation than what was provided, you should make an appointment with your provider to do so.   Our office handles many outgoing and incoming calls daily. If we have not contacted you within 1 week about your results, please check your mychart to see if there is a message first and if not, then contact our office.  In helping with this matter, you help decrease call volume, and therefore allow [place] to be able to respond  to patients needs more efficiently.   Acute office visits (sick visit):  An acute visit is intended for a new problem and are scheduled in shorter time slots to allow schedule openings for patients with new problems. This is the appropriate visit to discuss a new problem. In order to provide you with excellent quality medical care with proper time for you to explain your problem, have an exam and receive treatment with instructions, these appointments should be limited to one new problem per visit. If you experience a new problem, in which you desire to be  addressed, please make an acute office visit, we save openings on the schedule to accommodate you. Please do not save your new problem for any other type of visit, let us take care of it properly and quickly for you.   Follow up visits:  Depending on your condition(s) your provider will need to see you routinely in order to provide you with quality care and prescribe medication(s). Most chronic conditions (Example: hypertension, Diabetes, depression/anxiety... etc), require visits a couple times a year. Your provider will instruct you on proper follow up for your personal medical conditions and history. Please make certain to make follow up appointments for your condition as instructed. Failing to do so could result in lapse in your medication treatment/refills. If you request a refill, and are overdue to be seen on a condition, we will always provide you with a 30 day script (once) to allow you time to schedule.    Medicare wellness (well visit): - we have a wonderful Nurse Maudie Mercury), that will meet with you and provide you will yearly medicare wellness visits. These visits should occur yearly (can not be scheduled less than 1 calendar year apart) and cover preventive health, immunizations, advance directives and screenings you are entitled to yearly through your medicare benefits. Do not miss out on your entitled benefits, this is when medicare will pay for these benefits to be ordered for you.  These are strongly encouraged by your provider and is the appropriate type of visit to make certain you are up to date with all preventive health benefits. If you have not had your medicare wellness exam in the last 12 months, please make certain to schedule one by calling the office and schedule your medicare wellness with Maudie Mercury as soon as possible.   Yearly physical (well visit):  - Adults are recommended to be seen yearly for physicals. Check with your insurance and date of your last physical, most insurances  require one calendar year between physicals. Physicals include all preventive health topics, screenings, medical exam and labs that are appropriate for gender/age and history. You may have fasting labs needed at this visit. This is a well visit (not a sick visit), acute topics should not be covered during this visit.  - Pediatric patients are seen more frequently when they are younger. Your provider will advise you on well child visit timing that is appropriate for your their age. - This is not a medicare wellness visit. Medicare wellness exams do not have an exam portion to the visit. Some medicare companies allow for a physical, some do not allow a yearly physical. If your medicare allows a yearly physical you can schedule the medicare wellness with our nurse Maudie Mercury and have your physical with your provider after, on the same day. Please check with insurance for your full benefits.   Late Policy/No Shows:  - all new patients should arrive 15-30 minutes earlier than appointment to allow  [place] time  to  obtain all personal demographics,  insurance information and for you to complete office paperwork. - All established patients should arrive 10-15 minutes earlier than appointment time to update all information and be checked in .  - In our best efforts to run on time, if you are late for your appointment you will be asked to either reschedule or if able, we will work you back into the schedule. There will be a wait time to work you back in the schedule,  depending on availability.  - If you are unable to make it to your appointment as scheduled, please call 24 hours ahead of time to allow [place] to fill the time slot with someone else who needs to be seen. If you do not cancel your appointment ahead of time, you may be charged a no show fee.

## 2017-01-19 ENCOUNTER — Ambulatory Visit: Payer: 59 | Admitting: Osteopathic Medicine

## 2017-01-19 ENCOUNTER — Telehealth: Payer: Self-pay | Admitting: Family Medicine

## 2017-01-19 NOTE — Telephone Encounter (Signed)
Can we see if Dr. Leafy Ro is starting to schedule patients in advanced yet for weight loss management? I have a patient interested if she is and would need to place referral/give pt her # etc, however they are going about out. If she is not scheduling yet, do we have another option  for weight loss management referral in the area? Pt had been on phentermine in the past, but is willing to try other options.   Dr. Leafy Ro [phone]

## 2017-01-20 NOTE — Telephone Encounter (Signed)
Great - thanks

## 2017-01-20 NOTE — Telephone Encounter (Signed)
Dr. Leafy Ro is doing an information session tomorrow night or March 22nd. JoAnne with the weight loss clinic will call the patient to schedule her. No referral is needed.

## 2017-01-21 ENCOUNTER — Encounter (INDEPENDENT_AMBULATORY_CARE_PROVIDER_SITE_OTHER): Payer: 59 | Admitting: Family Medicine

## 2017-02-02 ENCOUNTER — Encounter (INDEPENDENT_AMBULATORY_CARE_PROVIDER_SITE_OTHER): Payer: Self-pay | Admitting: Family Medicine

## 2017-02-02 ENCOUNTER — Ambulatory Visit (INDEPENDENT_AMBULATORY_CARE_PROVIDER_SITE_OTHER): Payer: 59 | Admitting: Family Medicine

## 2017-02-02 VITALS — BP 150/82 | HR 91 | Temp 98.5°F | Resp 17 | Ht 60.0 in | Wt 173.0 lb

## 2017-02-02 DIAGNOSIS — R5383 Other fatigue: Secondary | ICD-10-CM

## 2017-02-02 DIAGNOSIS — Z0289 Encounter for other administrative examinations: Secondary | ICD-10-CM

## 2017-02-02 DIAGNOSIS — R030 Elevated blood-pressure reading, without diagnosis of hypertension: Secondary | ICD-10-CM | POA: Diagnosis not present

## 2017-02-02 DIAGNOSIS — Z9189 Other specified personal risk factors, not elsewhere classified: Secondary | ICD-10-CM

## 2017-02-02 DIAGNOSIS — R0602 Shortness of breath: Secondary | ICD-10-CM

## 2017-02-02 DIAGNOSIS — D649 Anemia, unspecified: Secondary | ICD-10-CM | POA: Diagnosis not present

## 2017-02-02 DIAGNOSIS — E559 Vitamin D deficiency, unspecified: Secondary | ICD-10-CM | POA: Diagnosis not present

## 2017-02-02 DIAGNOSIS — E669 Obesity, unspecified: Secondary | ICD-10-CM

## 2017-02-02 DIAGNOSIS — Z6833 Body mass index (BMI) 33.0-33.9, adult: Secondary | ICD-10-CM

## 2017-02-02 DIAGNOSIS — Z1389 Encounter for screening for other disorder: Secondary | ICD-10-CM

## 2017-02-02 DIAGNOSIS — Z1331 Encounter for screening for depression: Secondary | ICD-10-CM

## 2017-02-02 NOTE — Progress Notes (Signed)
Office: [phone]  /  Fax: [phone]   HPI:   Chief Complaint: OBESITY  Mackenzie Jones (MR# [id number]) is a 46 y.o. female who presents on 02/02/2017 for obesity evaluation and treatment. Current BMI is Body mass index is 33.79 kg/m.Marland Kitchen Mackenzie Jones has struggled with obesity for years and has been unsuccessful in either losing weight or maintaining long term weight loss. Mackenzie Jones attended our information session and states she is currently in the action stage of change and ready to dedicate time achieving and maintaining a healthier weight.  Mackenzie Jones states her family eats meals together she thinks her family will eat healthier with  her her desired weight loss is 35 lbs her heaviest weight ever was 175 lbs. she has significant food cravings issues  she snacks frequently in the evenings she skips meals frequently she is frequently drinking liquids with calories she frequently makes poor food choices she frequently eats larger portions than normal  she has binge eating behaviors   Fatigue Mackenzie Jones feels her energy is lower than it should be. This has worsened with weight gain and has not worsened recently. Mackenzie Jones admits to daytime somnolence and  admits to waking up still tired. Patient is at risk for obstructive sleep apnea. Patent has a history of symptoms of daytime fatigue. Patient generally gets 5 or 6 hours of sleep per night, and states they generally have restless sleep. Snoring is present. Apneic episodes are not present. Epworth Sleepiness Score is 5  Dyspnea on exertion Mackenzie Jones notes increasing shortness of breath with exercising and seems to be worsening over time with weight gain. She notes getting out of breath sooner with activity than she used to. This has not gotten worse recently. Mackenzie Jones denies orthopnea.  Vitamin D deficiency Mackenzie Jones has a diagnosis of vitamin D deficiency. She is currently taking OTC vit D. She admits fatigue and denies nausea, vomiting or muscle weakness.  Iron  Deficiency Anemia Mackenzie Jones has had anemia in the past but no recent labs. She admits fatigue and denies palpitations.  Elevated Blood Pressure with history of Hypertension Mackenzie Jones has elevated blood pressure and denies chest pain or palpitations. This may be white coat syndrome.  At risk for cardiovascular disease Mackenzie Jones is at a higher than average risk for cardiovascular disease due to obesity. She currently denies any chest pain.  Depression Screen Mackenzie Jones Food and Mood (modified PHQ-9) score was  Depression screen PHQ 2/9 02/02/2017  Decreased Interest 1  Down, Depressed, Hopeless 1  PHQ - 2 Score 2  Altered sleeping 0  Tired, decreased energy 2  Change in appetite 1  Feeling bad or failure about yourself  1  Trouble concentrating 1  Moving slowly or fidgety/restless 0  Suicidal thoughts 0  PHQ-9 Score 7    ALLERGIES: Allergies  Allergen Reactions  . Flagyl [Metronidazole]   . Sulfa Antibiotics     MEDICATIONS: Current Outpatient Prescriptions on File Prior to Visit  Medication Sig Dispense Refill  . fluticasone (FLONASE) 50 MCG/ACT nasal spray Place into both nostrils daily as needed for allergies or rhinitis.    . valACYclovir (VALTREX) 500 MG tablet      No current facility-administered medications on file prior to visit.     PAST MEDICAL HISTORY: Past Medical History:  Diagnosis Date  . Allergy   . Anemia   . 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 Vitamin D deficiency     PAST SURGICAL HISTORY: Past Surgical History:  Procedure Laterality Date  .  ABLATION    . lipo sucttion  2013  . WISDOM TOOTH EXTRACTION  1996    SOCIAL HISTORY: Social History  Substance Use Topics  . Smoking status: Never Smoker  . Smokeless tobacco: Never Used  . Alcohol use No    FAMILY HISTORY: Family History  Problem Relation Age of Onset  . Hypertension Mother   . Hyperlipidemia Mother   . Heart disease Maternal Grandfather   . Arthritis Paternal  Grandmother   . Hyperlipidemia Sister     ROS: Review of Systems  Constitutional: Positive for malaise/fatigue.  Eyes:       Wear Glasses or Contacts  Respiratory: Positive for shortness of breath (on exertion).   Cardiovascular: Negative for chest pain, palpitations and orthopnea.  Gastrointestinal: Negative for nausea and vomiting.  Musculoskeletal:       Negative Muscle Weakness    PHYSICAL EXAM: Blood pressure (!) 150/82, pulse 91, temperature 98.5 F (36.9 C), temperature source Oral, resp. rate 17, height 5' (1.524 m), weight 173 lb (78.5 kg), last menstrual period 11/30/2016, SpO2 97 %. Body mass index is 33.79 kg/m. Physical Exam  Constitutional: She is oriented to person, place, and time. She appears well-developed and well-nourished.  Cardiovascular: Normal rate.   Pulmonary/Chest: Effort normal.  Musculoskeletal: Normal range of motion.  Neurological: She is oriented to person, place, and time.  Skin: Skin is warm and dry.  Psychiatric: She has a normal mood and affect. Her behavior is normal.  Vitals reviewed.   RECENT LABS AND TESTS: BMET    Component Value Date/Time   NA 137 04/22/2016 0928   K 4.0 04/22/2016 0928   CL 105 04/22/2016 0928   CO2 23 04/22/2016 0928   GLUCOSE 84 04/22/2016 0928   BUN 12 04/22/2016 0928   CREATININE 0.75 04/22/2016 0928   CALCIUM 8.9 04/22/2016 0928   GFRNONAA >89 04/22/2016 0928   GFRAA >89 04/22/2016 0928   No results found for: HGBA1C No results found for: INSULIN CBC    Component Value Date/Time   WBC 8.1 04/22/2016 0928   RBC 4.15 04/22/2016 0928   HGB 12.1 04/22/2016 0928   HCT 35.6 04/22/2016 0928   PLT 316 04/22/2016 0928   MCV 85.8 04/22/2016 0928   MCH 29.2 04/22/2016 0928   MCHC 34.0 04/22/2016 0928   RDW 13.5 04/22/2016 0928   Iron/TIBC/Ferritin/ %Sat No results found for: IRON, TIBC, FERRITIN, IRONPCTSAT Lipid Panel     Component Value Date/Time   CHOL 254 (H) 04/22/2016 0928   TRIG 94  04/22/2016 0928   HDL 57 04/22/2016 0928   CHOLHDL 4.5 04/22/2016 0928   VLDL 19 04/22/2016 0928   LDLCALC 178 (H) 04/22/2016 0928   Hepatic Function Panel     Component Value Date/Time   PROT 6.9 04/22/2016 0928   ALBUMIN 4.2 04/22/2016 0928   AST 14 04/22/2016 0928   ALT 15 04/22/2016 0928   ALKPHOS 90 04/22/2016 0928   BILITOT 0.6 04/22/2016 0928      Component Value Date/Time   TSH 2.00 04/22/2016 0928   TSH 2.724 09/02/2015 1457    ECG  shows NSR with a rate of 92 BPM INDIRECT CALORIMETER done today shows a VO2 of 152 and a REE of 1058.    ASSESSMENT AND PLAN: Other fatigue - Plan: EKG 12-Lead, Hemoglobin A1c, Insulin, random, Comprehensive metabolic panel, Vitamin O24, Lipid Panel With LDL/HDL Ratio, T3, T4, free, TSH, Folate, CBC With Differential  Shortness of breath on exertion  Vitamin D  deficiency - Plan: VITAMIN D 25 Hydroxy (Vit-D Deficiency, Fractures)  Anemia, unspecified type - Plan: Anemia panel  Elevated blood pressure reading  Depression screening  At risk for heart disease  Class 1 obesity without serious comorbidity with body mass index (BMI) of 33.0 to 33.9 in adult, unspecified obesity type  PLAN:  Fatigue Mackenzie Jones was informed that her fatigue may be related to obesity, depression or many other causes. Labs will be ordered, and in the meanwhile Mackenzie Jones has agreed to work on diet, exercise and weight loss to help with fatigue. Proper sleep hygiene was discussed including the need for 7-8 hours of quality sleep each night. A sleep study was not ordered based on symptoms and Epworth score.  Dyspnea on exertion Mackenzie Jones's shortness of breath appears to be obesity related and exercise induced. She has agreed to work on weight loss and gradually increase exercise to treat her exercise induced shortness of breath. If Mackenzie Jones follows our instructions and loses weight without improvement of her shortness of breath, we will plan to refer to pulmonology. We will  monitor this condition regularly. Mackenzie Jones agrees to this plan.  Vitamin D Deficiency Mackenzie Jones was informed that low vitamin D levels contributes to fatigue and are associated with obesity, breast, and colon cancer. She agrees to continue to take prescription Vit D @50 ,000 IU every week and will follow up for routine testing of vitamin D, at least 2-3 times per year. She was informed of the risk of over-replacement of vitamin D and agrees to not increase her dose unless he discusses this with [place] first. We will check labs and follow.  Iron Deficiency Anemia Will check labs and follow   Elevated Blood Pressure We will re-check labs at next visit and may need to start medications if still elevated. She will continue to work on diet, exercise and weight loss in the meanwhile.  Cardiovascular risk counselling Mackenzie Jones was given extended (at least 15 minutes) coronary artery disease prevention counseling today. She is 46 y.o. female and has risk factors for heart disease including obesity. We discussed intensive lifestyle modifications today with an emphasis on specific weight loss instructions and strategies. Pt was also informed of the importance of increasing exercise and decreasing saturated fats to help prevent heart disease.  Depression Screen Mackenzie Jones had a positive depression screening. Depression is commonly associated with obesity and often results in emotional eating behaviors. We will monitor this closely and work on CBT to help improve the non-hunger eating patterns. Referral to Psychology may be required if no improvement is seen as she continues in our clinic.  Obesity Mackenzie Jones is currently in the action stage of change and her goal is to continue with weight loss efforts She has agreed to follow the Category 1 plan Mackenzie Jones has been instructed to work up to a goal of 150 minutes of combined cardio and strengthening exercise per week for weight loss and overall health benefits. We discussed the  following Behavioral Modification Stratagies today: increasing lean protein intake, decreasing simple carbohydrates , increasing lower sugar fruits and work on meal planning and easy cooking plans  Mackenzie Jones has agreed to follow up with our clinic in 2 weeks. She was informed of the importance of frequent follow up visits to maximize her success with intensive lifestyle modifications for her multiple health conditions. She was informed we would discuss her lab results at her next visit unless there is a critical issue that needs to be addressed sooner. Meleah agreed to keep her next  visit at the agreed upon time to discuss these results.  I, Doreene Nest, am acting as scribe for Dennard Nip, MD  I have reviewed the above documentation for accuracy and completeness, and I agree with the above. -Dennard Nip, MD

## 2017-02-03 LAB — COMPREHENSIVE METABOLIC PANEL
A/G RATIO: 1.6 (ref 1.2–2.2)
ALT: 17 IU/L (ref 0–32)
AST: 14 IU/L (ref 0–40)
Albumin: 4.3 g/dL (ref 3.5–5.5)
Alkaline Phosphatase: 120 IU/L — ABNORMAL HIGH (ref 39–117)
BUN/Creatinine Ratio: 26 — ABNORMAL HIGH (ref 9–23)
BUN: 15 mg/dL (ref 6–24)
CHLORIDE: 101 mmol/L (ref 96–106)
CO2: 21 mmol/L (ref 18–29)
Calcium: 9.2 mg/dL (ref 8.7–10.2)
Creatinine, Ser: 0.57 mg/dL (ref 0.57–1.00)
GFR, EST AFRICAN AMERICAN: 129 mL/min/{1.73_m2} (ref >59)
GFR, EST NON AFRICAN AMERICAN: 112 mL/min/{1.73_m2} (ref >59)
GLOBULIN, TOTAL: 2.7 g/dL (ref 1.5–4.5)
Glucose: 93 mg/dL (ref 65–99)
POTASSIUM: 4.1 mmol/L (ref 3.5–5.2)
SODIUM: 140 mmol/L (ref 134–144)
Total Protein: 7 g/dL (ref 6.0–8.5)

## 2017-02-03 LAB — LIPID PANEL WITH LDL/HDL RATIO
Cholesterol, Total: 256 mg/dL — ABNORMAL HIGH (ref 100–199)
HDL: 48 mg/dL (ref >39)
LDL Calculated: 189 mg/dL — ABNORMAL HIGH (ref 0–99)
LDL/HDL RATIO: 3.9 ratio — AB (ref 0.0–3.2)
TRIGLYCERIDES: 93 mg/dL (ref 0–149)
VLDL Cholesterol Cal: 19 mg/dL (ref 5–40)

## 2017-02-03 LAB — CBC WITH DIFFERENTIAL
BASOS ABS: 0 10*3/uL (ref 0.0–0.2)
Basos: 0 %
EOS (ABSOLUTE): 0.3 10*3/uL (ref 0.0–0.4)
Eos: 4 %
Hemoglobin: 11.9 g/dL (ref 11.1–15.9)
IMMATURE GRANULOCYTES: 0 %
Immature Grans (Abs): 0 10*3/uL (ref 0.0–0.1)
LYMPHS ABS: 2.7 10*3/uL (ref 0.7–3.1)
Lymphs: 36 %
MCH: 28.7 pg (ref 26.6–33.0)
MCHC: 33 g/dL (ref 31.5–35.7)
MCV: 87 fL (ref 79–97)
MONOCYTES: 5 %
MONOS ABS: 0.4 10*3/uL (ref 0.1–0.9)
NEUTROS PCT: 55 %
Neutrophils Absolute: 4 10*3/uL (ref 1.4–7.0)
RBC: 4.14 x10E6/uL (ref 3.77–5.28)
RDW: 13.7 % (ref 12.3–15.4)
WBC: 7.5 10*3/uL (ref 3.4–10.8)

## 2017-02-03 LAB — T3: T3 TOTAL: 106 ng/dL (ref 71–180)

## 2017-02-03 LAB — ANEMIA PANEL
Ferritin: 88 ng/mL (ref 15–150)
Folate, Hemolysate: 459.4 ng/mL
Folate, RBC: 1273 ng/mL (ref >498)
HEMATOCRIT: 36.1 % (ref 34.0–46.6)
IRON SATURATION: 18 % (ref 15–55)
IRON: 57 ug/dL (ref 27–159)
RETIC CT PCT: 1.4 % (ref 0.6–2.6)
Total Iron Binding Capacity: 322 ug/dL (ref 250–450)
UIBC: 265 ug/dL (ref 131–425)
Vitamin B-12: 442 pg/mL (ref 232–1245)

## 2017-02-03 LAB — HEMOGLOBIN A1C
ESTIMATED AVERAGE GLUCOSE: 111 mg/dL
Hgb A1c MFr Bld: 5.5 % (ref 4.8–5.6)

## 2017-02-03 LAB — T4, FREE: Free T4: 0.99 ng/dL (ref 0.82–1.77)

## 2017-02-03 LAB — TSH: TSH: 2.67 u[IU]/mL (ref 0.450–4.500)

## 2017-02-03 LAB — VITAMIN D 25 HYDROXY (VIT D DEFICIENCY, FRACTURES): VIT D 25 HYDROXY: 29.8 ng/mL — AB (ref 30.0–100.0)

## 2017-02-03 LAB — FOLATE: FOLATE: 8.6 ng/mL (ref >3.0)

## 2017-02-03 LAB — INSULIN, RANDOM: INSULIN: 8 u[IU]/mL (ref 2.6–24.9)

## 2017-02-16 ENCOUNTER — Ambulatory Visit (INDEPENDENT_AMBULATORY_CARE_PROVIDER_SITE_OTHER): Payer: 59 | Admitting: Family Medicine

## 2017-02-16 ENCOUNTER — Encounter (INDEPENDENT_AMBULATORY_CARE_PROVIDER_SITE_OTHER): Payer: Self-pay | Admitting: Family Medicine

## 2017-02-16 VITALS — BP 153/85 | HR 93 | Temp 98.4°F | Resp 16 | Ht 60.0 in | Wt 168.0 lb

## 2017-02-16 DIAGNOSIS — F3289 Other specified depressive episodes: Secondary | ICD-10-CM | POA: Diagnosis not present

## 2017-02-16 DIAGNOSIS — E669 Obesity, unspecified: Secondary | ICD-10-CM

## 2017-02-16 DIAGNOSIS — Z9189 Other specified personal risk factors, not elsewhere classified: Secondary | ICD-10-CM | POA: Diagnosis not present

## 2017-02-16 DIAGNOSIS — Z6832 Body mass index (BMI) 32.0-32.9, adult: Secondary | ICD-10-CM | POA: Diagnosis not present

## 2017-02-16 DIAGNOSIS — E559 Vitamin D deficiency, unspecified: Secondary | ICD-10-CM

## 2017-02-16 DIAGNOSIS — E785 Hyperlipidemia, unspecified: Secondary | ICD-10-CM | POA: Diagnosis not present

## 2017-02-16 MED ORDER — BUPROPION HCL ER (SR) 150 MG PO TB12: 150.0000 mg | ORAL_TABLET | Freq: Two times a day (BID) | ORAL | 0 refills | Status: DC

## 2017-02-16 MED ORDER — VITAMIN D (ERGOCALCIFEROL) 1.25 MG (50000 UNIT) PO CAPS: 50000.0000 [IU] | ORAL_CAPSULE | ORAL | 0 refills | Status: DC

## 2017-02-16 MED FILL — BUPROPION SR 150 MG TABLET: 150 | 30 days supply | Qty: 60 | Fill #0

## 2017-02-16 MED FILL — VIT D2 1.25 MG (50,000 UNIT: 1.25 MG | 28 days supply | Qty: 4 | Fill #0

## 2017-02-16 NOTE — Progress Notes (Signed)
Office: [phone]  /  Fax: [phone]   HPI:   Chief Complaint: OBESITY Mackenzie Jones is here to discuss her progress with her obesity treatment plan. She is following her eating plan approximately 80 % of the time and states she is exercising 0 minutes 0 times per week. Mackenzie Jones has done well with weight loss on category 1 plan. She struggled with snacking and often had an extra 100 calorie snack and especially noted p.m. cravings. Her weight is 168 lb (76.2 kg) today and has had a weight loss of 5 pounds over a period of 2 weeks since her last visit. She has lost 5 lbs since starting treatment with [place].  Vitamin D deficiency Mackenzie Jones has a new diagnosis of vitamin D deficiency. She is not currently taking vit D and denies nausea, vomiting or muscle weakness.  Depression with emotional eating behaviors Mackenzie Jones is struggling with increased emotional eating, present for months or longer. Her mood is stable overall. She is using food for comfort to the extent that it is negatively impacting her health. She sometimes feels snacking is out of control. Mackenzie Jones sometimes feels she is out of control and then feels guilty that she made poor food choices. She has been working on behavior modification techniques to help reduce her emotional eating and has been somewhat successful. She shows no sign of suicidal or homicidal ideations.  Hyperlipidemia Mackenzie Jones has hyperlipidemia, LDL is very elevated but HDL and Triglycerides are normal. She has a strong family history of elevated LDL. She has been trying to improve her cholesterol levels with intensive lifestyle modification including a low saturated fat diet, exercise and weight loss. She denies any chest pain, claudication or myalgias.  Depression screen Mackenzie Jones 2/9 02/02/2017 01/18/2017  Decreased Interest 1 0  Down, Depressed, Hopeless 1 0  PHQ - 2 Score 2 0  Altered sleeping 0 -  Tired, decreased energy 2 -  Change in appetite 1 -  Feeling bad or failure about  yourself  1 -  Trouble concentrating 1 -  Moving slowly or fidgety/restless 0 -  Suicidal thoughts 0 -  PHQ-9 Score 7 -     Wt Readings from Last 500 Encounters:  02/16/17 168 lb (76.2 kg)  02/02/17 173 lb (78.5 kg)  01/18/17 178 lb 8 oz (81 kg)  06/25/16 167 lb (75.8 kg)  06/04/16 169 lb (76.7 kg)  04/22/16 175 lb (79.4 kg)  10/01/15 159 lb (72.1 kg)  09/20/15 163 lb (73.9 kg)  09/02/15 158 lb (71.7 kg)  08/08/15 158 lb (71.7 kg)  07/15/15 156 lb (70.8 kg)  06/11/15 167 lb (75.8 kg)  11/11/12 155 lb (70.3 kg)  09/28/12 170 lb (77.1 kg)     ALLERGIES: Allergies  Allergen Reactions  . Flagyl [Metronidazole]   . Sulfa Antibiotics     MEDICATIONS: Current Outpatient Prescriptions on File Prior to Visit  Medication Sig Dispense Refill  . cholecalciferol (VITAMIN D) 1000 units tablet Take 1,000 Units by mouth daily.    . fluticasone (FLONASE) 50 MCG/ACT nasal spray Place into both nostrils daily as needed for allergies or rhinitis.    . Omega-3 Fatty Acids (FISH OIL) 1000 MG CAPS Take 1 capsule by mouth at bedtime.    . valACYclovir (VALTREX) 500 MG tablet      No current facility-administered medications on file prior to visit.     PAST MEDICAL HISTORY: Past Medical History:  Diagnosis Date  . Allergy   . Anemia   . Herpes simplex  without mention of complication    hsv2  . Vitamin D deficiency     PAST SURGICAL HISTORY: Past Surgical History:  Procedure Laterality Date  . ABLATION    . lipo sucttion  2013  . WISDOM TOOTH EXTRACTION  1996    SOCIAL HISTORY: Social History  Substance Use Topics  . Smoking status: Never Smoker  . Smokeless tobacco: Never Used  . Alcohol use No    FAMILY HISTORY: Family History  Problem Relation Age of Onset  . Hypertension Mother   . Hyperlipidemia Mother   . Heart disease Maternal Grandfather   . Arthritis Paternal Grandmother   . Hyperlipidemia Sister     ROS: Review of Systems  Constitutional: Positive for  weight loss.  Cardiovascular: Negative for chest pain and claudication.  Gastrointestinal: Negative for nausea and vomiting.  Musculoskeletal: Negative for myalgias.       Negative muscle weakness  Endo/Heme/Allergies:       Polyphagia  Psychiatric/Behavioral: Positive for depression. Negative for suicidal ideas.    PHYSICAL EXAM: Blood pressure (!) 153/85, pulse 93, temperature 98.4 F (36.9 C), temperature source Oral, resp. rate 16, height 5' (1.524 m), weight 168 lb (76.2 kg), last menstrual period 01/19/2017, SpO2 96 %. Body mass index is 32.81 kg/m. Physical Exam  Constitutional: She is oriented to person, place, and time. She appears well-developed and well-nourished.  Cardiovascular: Normal rate and regular rhythm.   Pulmonary/Chest: Effort normal.  Musculoskeletal: Normal range of motion.  Neurological: She is oriented to person, place, and time.  Skin: Skin is warm and dry.  Vitals reviewed.   RECENT LABS AND TESTS: BMET    Component Value Date/Time   NA 140 02/02/2017 0939   K 4.1 02/02/2017 0939   CL 101 02/02/2017 0939   CO2 21 02/02/2017 0939   GLUCOSE 93 02/02/2017 0939   GLUCOSE 84 04/22/2016 0928   BUN 15 02/02/2017 0939   CREATININE 0.57 02/02/2017 0939   CREATININE 0.75 04/22/2016 0928   CALCIUM 9.2 02/02/2017 0939   GFRNONAA 112 02/02/2017 0939   GFRNONAA >89 04/22/2016 0928   GFRAA 129 02/02/2017 0939   GFRAA >89 04/22/2016 0928   Lab Results  Component Value Date   HGBA1C 5.5 02/02/2017   Lab Results  Component Value Date   INSULIN 8.0 02/02/2017   CBC    Component Value Date/Time   WBC 7.5 02/02/2017 0939   WBC 8.1 04/22/2016 0928   RBC 4.14 02/02/2017 0939   RBC 4.15 04/22/2016 0928   HGB 12.1 04/22/2016 0928   HCT 36.1 02/02/2017 0939   PLT 316 04/22/2016 0928   MCV 87 02/02/2017 0939   MCH 28.7 02/02/2017 0939   MCH 29.2 04/22/2016 0928   MCHC 33.0 02/02/2017 0939   MCHC 34.0 04/22/2016 0928   RDW 13.7 02/02/2017 0939    LYMPHSABS 2.7 02/02/2017 0939   EOSABS 0.3 02/02/2017 0939   BASOSABS 0.0 02/02/2017 0939   Iron/TIBC/Ferritin/ %Sat    Component Value Date/Time   IRON 57 02/02/2017 0939   TIBC 322 02/02/2017 0939   FERRITIN 88 02/02/2017 0939   IRONPCTSAT 18 02/02/2017 0939   Lipid Panel     Component Value Date/Time   CHOL 256 (H) 02/02/2017 0939   TRIG 93 02/02/2017 0939   HDL 48 02/02/2017 0939   CHOLHDL 4.5 04/22/2016 0928   VLDL 19 04/22/2016 0928   LDLCALC 189 (H) 02/02/2017 0939   Hepatic Function Panel     Component Value Date/Time  PROT 7.0 02/02/2017 0939   ALBUMIN 4.3 02/02/2017 0939   AST 14 02/02/2017 0939   ALT 17 02/02/2017 0939   ALKPHOS 120 (H) 02/02/2017 0939   BILITOT <0.2 02/02/2017 0939      Component Value Date/Time   TSH 2.670 02/02/2017 0939   TSH 2.00 04/22/2016 0928   TSH 2.724 09/02/2015 1457    ASSESSMENT AND PLAN: Vitamin D deficiency - Plan: Vitamin D, Ergocalciferol, (DRISDOL) 50000 units CAPS capsule  Other depression - Plan: buPROPion (WELLBUTRIN SR) 150 MG 12 hr tablet  Hyperlipidemia, unspecified hyperlipidemia type  At risk for heart disease  Class 1 obesity without serious comorbidity with body mass index (BMI) of 32.0 to 32.9 in adult, unspecified obesity type  PLAN:  Vitamin D Deficiency Mackenzie Jones was informed that low vitamin D levels contributes to fatigue and are associated with obesity, breast, and colon cancer. She agrees to start to take prescription Vit D @50 ,000 IU every week #4 with no refills and we will re-check labs in 3 months and will follow up for routine testing of vitamin D, at least 2-3 times per year. She was informed of the risk of over-replacement of vitamin D and agrees to not increase her dose unless he discusses this with [place] first.  Depression with Emotional Eating Behaviors We discussed behavior modification techniques today to help Mackenzie Jones deal with her emotional eating and depression. She has agreed to start to  take Wellbutrin SR 150 mg qd #30 with no refills and agreed to follow up as directed.  Hyperlipidemia Mackenzie Jones was informed of the American Heart Association Guidelines emphasizing intensive lifestyle modifications as the first line treatment for hyperlipidemia. We discussed many lifestyle modifications today in depth, and Mackenzie Jones will continue to work on decreasing saturated fats such as fatty red meat, butter and many fried foods. She will also increase vegetables and lean protein in her diet and continue to work on exercise and weight loss efforts.  Cardiovascular risk counselling Mackenzie Jones was given extended (30+ minutes) coronary artery disease prevention counseling today. She is 46 y.o. female and has risk factors for heart disease including obesity. We discussed intensive lifestyle modifications today with an emphasis on specific weight loss instructions and strategies. Pt was also informed of the importance of increasing exercise and decreasing saturated fats to help prevent heart disease.  Obesity Mackenzie Jones is currently in the action stage of change. As such, her goal is to continue with weight loss efforts She has agreed to follow the Category 1 plan +100 calories Mackenzie Jones has been instructed to work up to a goal of 150 minutes of combined cardio and strengthening exercise per week for weight loss and overall health benefits. We discussed the following Behavioral Modification Stratagies today: increasing lean protein intake, emotional eating strategies and ways to avoid night time snacking  Mackenzie Jones has agreed to follow up with our clinic in 2 weeks. She was informed of the importance of frequent follow up visits to maximize her success with intensive lifestyle modifications for her multiple health conditions.  I, Doreene Nest, am acting as scribe for Dennard Nip, MD  I have reviewed the above documentation for accuracy and completeness, and I agree with the above. -Dennard Nip, MD

## 2017-02-18 ENCOUNTER — Encounter (INDEPENDENT_AMBULATORY_CARE_PROVIDER_SITE_OTHER): Payer: 59 | Admitting: Family Medicine

## 2017-02-20 ENCOUNTER — Encounter (INDEPENDENT_AMBULATORY_CARE_PROVIDER_SITE_OTHER): Payer: Self-pay | Admitting: Family Medicine

## 2017-03-01 ENCOUNTER — Ambulatory Visit (INDEPENDENT_AMBULATORY_CARE_PROVIDER_SITE_OTHER): Payer: 59 | Admitting: Family Medicine

## 2017-03-04 ENCOUNTER — Ambulatory Visit (INDEPENDENT_AMBULATORY_CARE_PROVIDER_SITE_OTHER): Payer: 59 | Admitting: Family Medicine

## 2017-03-04 VITALS — BP 134/80 | HR 107 | Temp 98.5°F | Resp 16 | Ht 60.0 in | Wt 162.0 lb

## 2017-03-04 DIAGNOSIS — F3289 Other specified depressive episodes: Secondary | ICD-10-CM

## 2017-03-04 DIAGNOSIS — E669 Obesity, unspecified: Secondary | ICD-10-CM | POA: Diagnosis not present

## 2017-03-04 DIAGNOSIS — E559 Vitamin D deficiency, unspecified: Secondary | ICD-10-CM | POA: Diagnosis not present

## 2017-03-04 DIAGNOSIS — Z6831 Body mass index (BMI) 31.0-31.9, adult: Secondary | ICD-10-CM | POA: Diagnosis not present

## 2017-03-04 MED ORDER — BUPROPION HCL ER (SR) 150 MG PO TB12: 150.0000 mg | ORAL_TABLET | Freq: Two times a day (BID) | ORAL | 0 refills | Status: DC

## 2017-03-04 MED ORDER — VITAMIN D (ERGOCALCIFEROL) 1.25 MG (50000 UNIT) PO CAPS: 50000.0000 [IU] | ORAL_CAPSULE | ORAL | 0 refills | Status: DC

## 2017-03-04 NOTE — Progress Notes (Signed)
Office: [phone]  /  Fax: [phone]   HPI:   Chief Complaint: OBESITY Mackenzie Jones is here to discuss her progress with her obesity treatment plan. She is following her eating plan approximately 80 % of the time and states she is exercising 0 minutes 0 times per week. Mackenzie Jones continues to do well with weight loss, even over Easter. She increased lean protein, decreased snacking and eating out. Mackenzie Jones had minimal family sabotage. She is bored with lunch and would like some additional options. Her weight is 162 lb (73.5 kg) today and has had a weight loss of 6 pounds over a period of 2 weeks since her last visit. She has lost 11 lbs since starting treatment with [place].  Other Depression with emotional eating behaviors Mackenzie Jones is struggling with emotional eating and using food for comfort to the extent that it is negatively impacting her health. She often snacks when she is not hungry. Mackenzie Jones sometimes feels she is out of control and then feels guilty that she made poor food choices. She is now doing well on Wellbutrin. She notes dry mouth and has increased H2O. She has been working on behavior modification techniques to help reduce her emotional eating and has been somewhat successful. She has no insomnia, blood pressure is stable and She shows no sign of suicidal or homicidal ideations.  Vitamin D deficiency Mackenzie Jones has a diagnosis of vitamin D deficiency. She is currently stable on vit D, not yet at goal and denies nausea, vomiting or muscle weakness.  Depression screen [facility] 2/9 02/02/2017 01/18/2017  Decreased Interest 1 0  Down, Depressed, Hopeless 1 0  PHQ - 2 Score 2 0  Altered sleeping 0 -  Tired, decreased energy 2 -  Change in appetite 1 -  Feeling bad or failure about yourself  1 -  Trouble concentrating 1 -  Moving slowly or fidgety/restless 0 -  Suicidal thoughts 0 -  PHQ-9 Score 7 -     Wt Readings from Last 500 Encounters:  03/04/17 162 lb (73.5 kg)  02/16/17 168 lb (76.2 kg)    02/02/17 173 lb (78.5 kg)  01/18/17 178 lb 8 oz (81 kg)  06/25/16 167 lb (75.8 kg)  06/04/16 169 lb (76.7 kg)  04/22/16 175 lb (79.4 kg)  10/01/15 159 lb (72.1 kg)  09/20/15 163 lb (73.9 kg)  09/02/15 158 lb (71.7 kg)  08/08/15 158 lb (71.7 kg)  07/15/15 156 lb (70.8 kg)  06/11/15 167 lb (75.8 kg)  11/11/12 155 lb (70.3 kg)  09/28/12 170 lb (77.1 kg)     ALLERGIES: Allergies  Allergen Reactions  . Flagyl [Metronidazole]   . Sulfa Antibiotics     MEDICATIONS: Current Outpatient Prescriptions on File Prior to Visit  Medication Sig Dispense Refill  . cholecalciferol (VITAMIN D) 1000 units tablet Take 1,000 Units by mouth daily.    . fluticasone (FLONASE) 50 MCG/ACT nasal spray Place into both nostrils daily as needed for allergies or rhinitis.    . Omega-3 Fatty Acids (FISH OIL) 1000 MG CAPS Take 1 capsule by mouth at bedtime.    . valACYclovir (VALTREX) 500 MG tablet      No current facility-administered medications on file prior to visit.     PAST MEDICAL HISTORY: Past Medical History:  Diagnosis Date  . Allergy   . Anemia   . 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 Vitamin D deficiency     PAST SURGICAL HISTORY: Past Surgical History:  Procedure Laterality Date  . ABLATION    . lipo sucttion  2013  . WISDOM TOOTH EXTRACTION  1996    SOCIAL HISTORY: Social History  Substance Use Topics  . Smoking status: Never Smoker  . Smokeless tobacco: Never Used  . Alcohol use No    FAMILY HISTORY: Family History  Problem Relation Age of Onset  . Hypertension Mother   . Hyperlipidemia Mother   . Heart disease Maternal Grandfather   . Arthritis Paternal Grandmother   . Hyperlipidemia Sister     ROS: Review of Systems  Constitutional: Positive for weight loss.  Cardiovascular: Negative for claudication.  Gastrointestinal: Negative for nausea and vomiting.  Musculoskeletal:       Negative muscle weakness  Psychiatric/Behavioral: Negative  for suicidal ideas. The patient does not have insomnia.     PHYSICAL EXAM: Blood pressure 134/80, pulse (!) 107, temperature 98.5 F (36.9 C), temperature source Oral, resp. rate 16, height 5' (1.524 m), weight 162 lb (73.5 kg), SpO2 98 %. Body mass index is 31.64 kg/m. Physical Exam  Constitutional: She is oriented to person, place, and time. She appears well-developed and well-nourished.  Cardiovascular: Normal rate.   Pulmonary/Chest: Effort normal.  Musculoskeletal: Normal range of motion.  Neurological: She is oriented to person, place, and time.  Skin: Skin is warm and dry.  Vitals reviewed.   RECENT LABS AND TESTS: BMET    Component Value Date/Time   NA 140 02/02/2017 0939   K 4.1 02/02/2017 0939   CL 101 02/02/2017 0939   CO2 21 02/02/2017 0939   GLUCOSE 93 02/02/2017 0939   GLUCOSE 84 04/22/2016 0928   BUN 15 02/02/2017 0939   CREATININE 0.57 02/02/2017 0939   CREATININE 0.75 04/22/2016 0928   CALCIUM 9.2 02/02/2017 0939   GFRNONAA 112 02/02/2017 0939   GFRNONAA >89 04/22/2016 0928   GFRAA 129 02/02/2017 0939   GFRAA >89 04/22/2016 0928   Lab Results  Component Value Date   HGBA1C 5.5 02/02/2017   Lab Results  Component Value Date   INSULIN 8.0 02/02/2017   CBC    Component Value Date/Time   WBC 7.5 02/02/2017 0939   WBC 8.1 04/22/2016 0928   RBC 4.14 02/02/2017 0939   RBC 4.15 04/22/2016 0928   HGB 12.1 04/22/2016 0928   HCT 36.1 02/02/2017 0939   PLT 316 04/22/2016 0928   MCV 87 02/02/2017 0939   MCH 28.7 02/02/2017 0939   MCH 29.2 04/22/2016 0928   MCHC 33.0 02/02/2017 0939   MCHC 34.0 04/22/2016 0928   RDW 13.7 02/02/2017 0939   LYMPHSABS 2.7 02/02/2017 0939   EOSABS 0.3 02/02/2017 0939   BASOSABS 0.0 02/02/2017 0939   Iron/TIBC/Ferritin/ %Sat    Component Value Date/Time   IRON 57 02/02/2017 0939   TIBC 322 02/02/2017 0939   FERRITIN 88 02/02/2017 0939   IRONPCTSAT 18 02/02/2017 0939   Lipid Panel     Component Value Date/Time     CHOL 256 (H) 02/02/2017 0939   TRIG 93 02/02/2017 0939   HDL 48 02/02/2017 0939   CHOLHDL 4.5 04/22/2016 0928   VLDL 19 04/22/2016 0928   LDLCALC 189 (H) 02/02/2017 0939   Hepatic Function Panel     Component Value Date/Time   PROT 7.0 02/02/2017 0939   ALBUMIN 4.3 02/02/2017 0939   AST 14 02/02/2017 0939   ALT 17 02/02/2017 0939   ALKPHOS 120 (H) 02/02/2017 0939   BILITOT <0.2 02/02/2017 0939      Component  Value Date/Time   TSH 2.670 02/02/2017 0939   TSH 2.00 04/22/2016 0928   TSH 2.724 09/02/2015 1457    ASSESSMENT AND PLAN: Other depression - Plan: buPROPion (WELLBUTRIN SR) 150 MG 12 hr tablet  Vitamin D deficiency - Plan: Vitamin D, Ergocalciferol, (DRISDOL) 50000 units CAPS capsule  Class 1 obesity without serious comorbidity with body mass index (BMI) of 31.0 to 31.9 in adult, unspecified obesity type  PLAN:  Other Depression with Emotional Eating Behaviors We discussed behavior modification techniques today to help Mackenzie Jones deal with her emotional eating and depression. She has agreed to continue to take Wellbutrin SR 150 mg qd, we will refill for 1 month and she agreed to follow up as directed.  Vitamin D Deficiency Mackenzie Jones was informed that low vitamin D levels contributes to fatigue and are associated with obesity, breast, and colon cancer. She agrees to continue to take prescription Vit D @50 ,000 IU every week, we will refill for 1 month and will follow up for routine testing of vitamin D, at least 2-3 times per year. She was informed of the risk of over-replacement of vitamin D and agrees to not increase her dose unless he discusses this with [place] first. She agrees to follow up with our clinic in 2 weeks.  Obesity Mackenzie Jones is currently in the action stage of change. As such, her goal is to continue with weight loss efforts She has agreed to keep a food journal with 300 to 450 calories and 30+ grams of protein at lunch daily and follow the category 2 plan  otherwise Mackenzie Jones has been instructed to work up to a goal of 150 minutes of combined cardio and strengthening exercise per week for weight loss and overall health benefits. We discussed the following Behavioral Modification Strategies today: increasing lean protein intake and increasing lower sugar fruits  Mackenzie Jones has agreed to follow up with our clinic in 2 weeks. She was informed of the importance of frequent follow up visits to maximize her success with intensive lifestyle modifications for her multiple health conditions.  I, Doreene Nest, am acting as scribe for Dennard Nip, MD  I have reviewed the above documentation for accuracy and completeness, and I agree with the above. -Dennard Nip, MD

## 2017-03-12 ENCOUNTER — Encounter (INDEPENDENT_AMBULATORY_CARE_PROVIDER_SITE_OTHER): Payer: Self-pay | Admitting: Family Medicine

## 2017-03-17 ENCOUNTER — Ambulatory Visit (INDEPENDENT_AMBULATORY_CARE_PROVIDER_SITE_OTHER): Payer: 59 | Admitting: Family Medicine

## 2017-03-17 VITALS — BP 134/80 | HR 115 | Temp 98.9°F | Ht 60.0 in | Wt 161.0 lb

## 2017-03-17 DIAGNOSIS — E669 Obesity, unspecified: Secondary | ICD-10-CM | POA: Diagnosis not present

## 2017-03-17 DIAGNOSIS — F3289 Other specified depressive episodes: Secondary | ICD-10-CM

## 2017-03-17 DIAGNOSIS — Z6831 Body mass index (BMI) 31.0-31.9, adult: Secondary | ICD-10-CM | POA: Diagnosis not present

## 2017-03-17 DIAGNOSIS — E559 Vitamin D deficiency, unspecified: Secondary | ICD-10-CM | POA: Diagnosis not present

## 2017-03-17 MED ORDER — VITAMIN D (ERGOCALCIFEROL) 1.25 MG (50000 UNIT) PO CAPS: 50000.0000 [IU] | ORAL_CAPSULE | ORAL | 0 refills | Status: DC

## 2017-03-17 MED ORDER — BUPROPION HCL ER (SR) 150 MG PO TB12: 150.0000 mg | ORAL_TABLET | Freq: Two times a day (BID) | ORAL | 0 refills | Status: DC

## 2017-03-17 MED FILL — BUPROPION SR 150 MG TABLET: 150 | 30 days supply | Qty: 60 | Fill #0

## 2017-03-17 MED FILL — VIT D2 1.25 MG (50,000 UNIT: 1.25 MG | 28 days supply | Qty: 4 | Fill #0

## 2017-03-17 NOTE — Progress Notes (Signed)
Office: [phone]  /  Fax: [phone]   HPI:   Chief Complaint: OBESITY Mackenzie Jones is here to discuss her progress with her obesity treatment plan. She is following Journaling 300 to 450 calories and 30 grams of protein for lunch and is following her Category 2 eating plan approximately 50 % of the time and states she is exercising 0 minutes 0 times per week. Mackenzie Jones continues to do well with weight loss. She is working much longer hours at work and struggling with planning meals. She doesn't have time to exercise much right now. Her weight is 161 lb (73 kg) today and has had a weight loss of 1 pound over a period of 1 week since her last visit. She has lost 12 lbs since starting treatment with [place].  Depression with emotional eating behaviors Mackenzie Jones is struggling with emotional eating and using food for comfort to the extent that it is negatively impacting her health. She often snacks when she is not hungry. Mackenzie Jones sometimes feels she is out of control and then feels guilty that she made poor food choices. She has been working on behavior modification techniques to help reduce her emotional eating and has done well with decreasing emotional eating. Her blood pressure is stable and she denies insomnia. She shows no sign of suicidal or homicidal ideations.   Vitamin D deficiency Mackenzie Jones has a diagnosis of vitamin D deficiency. She is currently stable on vit D and denies nausea, vomiting or muscle weakness.  Depression screen Mackenzie Jones  Decreased Interest 1 0  Down, Depressed, Hopeless 1 0  PHQ - 2 Score 2 0  Altered sleeping 0 -  Tired, decreased energy 2 -  Change in appetite 1 -  Feeling bad or failure about yourself  1 -  Trouble concentrating 1 -  Moving slowly or fidgety/restless 0 -  Suicidal thoughts 0 -  PHQ-9 Score 7 -     Wt Readings from Last 500 Encounters:  03/17/17 161 lb (73 kg)  03/04/17 162 lb (73.5 kg)  02/16/17 168 lb (76.2 kg)  02/02/17 173 lb (78.5  kg)  01/18/17 178 lb 8 oz (81 kg)  06/25/16 167 lb (75.8 kg)  06/04/16 169 lb (76.7 kg)  04/22/16 175 lb (79.4 kg)  10/01/15 159 lb (72.1 kg)  09/20/15 163 lb (73.9 kg)  09/02/15 158 lb (71.7 kg)  08/08/15 158 lb (71.7 kg)  07/15/15 156 lb (70.8 kg)  06/11/15 167 lb (75.8 kg)  11/11/12 155 lb (70.3 kg)  09/28/12 170 lb (77.1 kg)     ALLERGIES: Allergies  Allergen Reactions  . Flagyl [Metronidazole]   . Sulfa Antibiotics     MEDICATIONS: Current Outpatient Prescriptions on File Prior to Visit  Medication Sig Dispense Refill  . buPROPion (WELLBUTRIN SR) 150 MG 12 hr tablet Take 1 tablet (150 mg total) by mouth 2 (two) times daily. 30 tablet 0  . fluticasone (FLONASE) 50 MCG/ACT nasal spray Place into both nostrils daily as needed for allergies or rhinitis.    . valACYclovir (VALTREX) 500 MG tablet     . Vitamin D, Ergocalciferol, (DRISDOL) 50000 units CAPS capsule Take 1 capsule (50,000 Units total) by mouth every 7 (seven) days. 4 capsule 0   No current facility-administered medications on file prior to visit.     PAST MEDICAL HISTORY: Past Medical History:  Diagnosis Date  . Allergy   . Anemia   . 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  Vitamin D deficiency     PAST SURGICAL HISTORY: Past Surgical History:  Procedure Laterality Date  . ABLATION    . lipo sucttion  2013  . WISDOM TOOTH EXTRACTION  1996    SOCIAL HISTORY: Social History  Substance Use Topics  . Smoking status: Never Smoker  . Smokeless tobacco: Never Used  . Alcohol use No    FAMILY HISTORY: Family History  Problem Relation Age of Onset  . Hypertension Mother   . Hyperlipidemia Mother   . Heart disease Maternal Grandfather   . Arthritis Paternal Grandmother   . Hyperlipidemia Sister     ROS: Review of Systems  Constitutional: Positive for weight loss.  Gastrointestinal: Negative for nausea and vomiting.  Musculoskeletal:       Negative muscle weakness    Psychiatric/Behavioral: Positive for depression. Negative for suicidal ideas. The patient does not have insomnia.     PHYSICAL EXAM: Blood pressure 134/80, pulse (!) 115, temperature 98.9 F (37.2 C), temperature source Oral, height 5' (1.524 m), weight 161 lb (73 kg), SpO2 99 %. Body mass index is 31.44 kg/m. Physical Exam  Constitutional: She is oriented to person, place, and time. She appears well-developed and well-nourished.  Cardiovascular: Normal rate.   Pulmonary/Chest: Effort normal.  Musculoskeletal: Normal range of motion.  Neurological: She is oriented to person, place, and time.  Skin: Skin is warm and dry.  Vitals reviewed.   RECENT LABS AND TESTS: BMET    Component Value Date/Time   NA 140 02/02/2017 0939   K 4.1 02/02/2017 0939   CL 101 02/02/2017 0939   CO2 21 02/02/2017 0939   GLUCOSE 93 02/02/2017 0939   GLUCOSE 84 04/22/2016 0928   BUN 15 02/02/2017 0939   CREATININE 0.57 02/02/2017 0939   CREATININE 0.75 04/22/2016 0928   CALCIUM 9.2 02/02/2017 0939   GFRNONAA 112 02/02/2017 0939   GFRNONAA >89 04/22/2016 0928   GFRAA 129 02/02/2017 0939   GFRAA >89 04/22/2016 0928   Lab Results  Component Value Date   HGBA1C 5.5 02/02/2017   Lab Results  Component Value Date   INSULIN 8.0 02/02/2017   CBC    Component Value Date/Time   WBC 7.5 02/02/2017 0939   WBC 8.1 04/22/2016 0928   RBC 4.14 02/02/2017 0939   RBC 4.15 04/22/2016 0928   HGB 12.1 04/22/2016 0928   HCT 36.1 02/02/2017 0939   PLT 316 04/22/2016 0928   MCV 87 02/02/2017 0939   MCH 28.7 02/02/2017 0939   MCH 29.2 04/22/2016 0928   MCHC 33.0 02/02/2017 0939   MCHC 34.0 04/22/2016 0928   RDW 13.7 02/02/2017 0939   LYMPHSABS 2.7 02/02/2017 0939   EOSABS 0.3 02/02/2017 0939   BASOSABS 0.0 02/02/2017 0939   Iron/TIBC/Ferritin/ %Sat    Component Value Date/Time   IRON 57 02/02/2017 0939   TIBC 322 02/02/2017 0939   FERRITIN 88 02/02/2017 0939   IRONPCTSAT 18 02/02/2017 0939    Lipid Panel     Component Value Date/Time   CHOL 256 (H) 02/02/2017 0939   TRIG 93 02/02/2017 0939   HDL 48 02/02/2017 0939   CHOLHDL 4.5 04/22/2016 0928   VLDL 19 04/22/2016 0928   LDLCALC 189 (H) 02/02/2017 0939   Hepatic Function Panel     Component Value Date/Time   PROT 7.0 02/02/2017 0939   ALBUMIN 4.3 02/02/2017 0939   AST 14 02/02/2017 0939   ALT 17 02/02/2017 0939   ALKPHOS 120 (H) 02/02/2017 0939   BILITOT <  0.2 02/02/2017 0939      Component Value Date/Time   TSH 2.670 02/02/2017 0939   TSH 2.00 04/22/2016 0928   TSH 2.724 09/02/2015 1457    ASSESSMENT AND PLAN: Other depression - Plan: buPROPion (WELLBUTRIN SR) 150 MG 12 hr tablet  Vitamin D deficiency - Plan: Vitamin D, Ergocalciferol, (DRISDOL) 50000 units CAPS capsule  Class 1 obesity without serious comorbidity with body mass index (BMI) of 31.0 to 31.9 in adult, unspecified obesity type  PLAN:  Depression with Emotional Eating Behaviors We discussed behavior modification techniques today to help Shayma deal with her emotional eating and depression. She has agreed to continue to take Wellbutrin SR 150 mg qd, we will refill for 1 month and agreed to follow up as directed.  Vitamin D Deficiency Mackenzie Jones was informed that low vitamin D levels contributes to fatigue and are associated with obesity, breast, and colon cancer. She agrees to continue to take prescription Vit D @50 ,000 IU every week, we will refill for 1 month and will follow up for routine testing of vitamin D, at least 2-3 times per year. She was informed of the risk of over-replacement of vitamin D and agrees to not increase her dose unless he discusses this with [place] first. Mackenzie Jones agrees to follow up with our clinic in 2 weeks.  Obesity Mackenzie Jones is currently in the action stage of change. As such, her goal is to continue with weight loss efforts She has agreed to follow the Category 2 plan Mackenzie Jones has been instructed to work up to a goal of 150 minutes  of combined cardio and strengthening exercise per week or Mackenzie Jones to start standing and pacing while talking on the phone for weight loss and overall health benefits. We discussed the following Behavioral Modification Stratagies today: increasing lean protein intake, emotional eating strategies, ways to avoid boredom eating and avoiding temptations  Mackenzie Jones has agreed to follow up with our clinic in 2 weeks. She was informed of the importance of frequent follow up visits to maximize her success with intensive lifestyle modifications for her multiple health conditions.  I, Doreene Nest, am acting as scribe for Dennard Nip, MD  I have reviewed the above documentation for accuracy and completeness, and I agree with the above. -Dennard Nip, MD

## 2017-03-18 ENCOUNTER — Encounter (INDEPENDENT_AMBULATORY_CARE_PROVIDER_SITE_OTHER): Payer: Self-pay | Admitting: Family Medicine

## 2017-03-23 ENCOUNTER — Encounter (INDEPENDENT_AMBULATORY_CARE_PROVIDER_SITE_OTHER): Payer: Self-pay | Admitting: Family Medicine

## 2017-03-23 NOTE — Telephone Encounter (Signed)
Can we work her in sooner?

## 2017-03-26 ENCOUNTER — Encounter (INDEPENDENT_AMBULATORY_CARE_PROVIDER_SITE_OTHER): Payer: Self-pay | Admitting: Family Medicine

## 2017-03-26 NOTE — Telephone Encounter (Signed)
Please cancel.

## 2017-03-31 ENCOUNTER — Ambulatory Visit (INDEPENDENT_AMBULATORY_CARE_PROVIDER_SITE_OTHER): Payer: 59 | Admitting: Family Medicine

## 2017-03-31 ENCOUNTER — Encounter (INDEPENDENT_AMBULATORY_CARE_PROVIDER_SITE_OTHER): Payer: Self-pay

## 2017-03-31 DIAGNOSIS — J3089 Other allergic rhinitis: Secondary | ICD-10-CM | POA: Diagnosis not present

## 2017-03-31 DIAGNOSIS — L509 Urticaria, unspecified: Secondary | ICD-10-CM | POA: Diagnosis not present

## 2017-03-31 DIAGNOSIS — J301 Allergic rhinitis due to pollen: Secondary | ICD-10-CM | POA: Diagnosis not present

## 2017-04-06 ENCOUNTER — Ambulatory Visit (INDEPENDENT_AMBULATORY_CARE_PROVIDER_SITE_OTHER): Payer: 59 | Admitting: Family Medicine

## 2017-04-06 VITALS — BP 130/63 | HR 87 | Temp 97.8°F | Ht 60.0 in | Wt 160.0 lb

## 2017-04-06 DIAGNOSIS — E669 Obesity, unspecified: Secondary | ICD-10-CM | POA: Insufficient documentation

## 2017-04-06 DIAGNOSIS — Z6831 Body mass index (BMI) 31.0-31.9, adult: Secondary | ICD-10-CM | POA: Diagnosis not present

## 2017-04-06 DIAGNOSIS — F3289 Other specified depressive episodes: Secondary | ICD-10-CM

## 2017-04-06 DIAGNOSIS — F329 Major depressive disorder, single episode, unspecified: Secondary | ICD-10-CM | POA: Insufficient documentation

## 2017-04-06 DIAGNOSIS — F32A Depression, unspecified: Secondary | ICD-10-CM | POA: Insufficient documentation

## 2017-04-06 MED ORDER — TOPIRAMATE 25 MG PO TABS: 25.0000 mg | ORAL_TABLET | Freq: Every day | ORAL | 0 refills | Status: DC

## 2017-04-06 NOTE — Progress Notes (Signed)
Office: [phone]  /  Fax: [phone]   HPI:   Chief Complaint: OBESITY Mackenzie Jones is here to discuss her progress with her obesity treatment plan. She is on the  follow the Category 1 plan and is following her eating plan approximately 50 % of the time. She states she is walking 30 minutes 7 times per week. Mackenzie Jones is struggling to follow plan, still notes emotional eating, especially in pm. She is not eating all her protein and is deviating from plan significantly. Her weight is 160 lb (72.6 kg) today and has had a weight loss of 1 pound over a period of 3 weeks since her last visit. She has lost 13 lbs since starting treatment with [place].  Depression with emotional eating behaviors Mackenzie Jones is struggling with emotional eating and using food for comfort to the extent that it is negatively impacting her health. She often snacks when she is not hungry. Mackenzie Jones sometimes feels she is out of control and then feels guilty that she made poor food choices. She started wellbutrin and had hives. She thinks this is related, so she stopped. She has been working on behavior modification techniques to help reduce her emotional eating and has been somewhat successful. She shows no sign of suicidal or homicidal ideations.  Depression screen [facility] 2/9 02/02/2017 01/18/2017  Decreased Interest 1 0  Down, Depressed, Hopeless 1 0  PHQ - 2 Score 2 0  Altered sleeping 0 -  Tired, decreased energy 2 -  Change in appetite 1 -  Feeling bad or failure about yourself  1 -  Trouble concentrating 1 -  Moving slowly or fidgety/restless 0 -  Suicidal thoughts 0 -  PHQ-9 Score 7 -     Wt Readings from Last 500 Encounters:  04/06/17 160 lb (72.6 kg)  03/17/17 161 lb (73 kg)  03/04/17 162 lb (73.5 kg)  02/16/17 168 lb (76.2 kg)  02/02/17 173 lb (78.5 kg)  01/18/17 178 lb 8 oz (81 kg)  06/25/16 167 lb (75.8 kg)  06/04/16 169 lb (76.7 kg)  04/22/16 175 lb (79.4 kg)  10/01/15 159 lb (72.1 kg)  09/20/15 163 lb (73.9 kg)    09/02/15 158 lb (71.7 kg)  08/08/15 158 lb (71.7 kg)  07/15/15 156 lb (70.8 kg)  06/11/15 167 lb (75.8 kg)  11/11/12 155 lb (70.3 kg)  09/28/12 170 lb (77.1 kg)     ALLERGIES: Allergies  Allergen Reactions  . Flagyl [Metronidazole]   . Sulfa Antibiotics     MEDICATIONS: Current Outpatient Prescriptions on File Prior to Visit  Medication Sig Dispense Refill  . fluticasone (FLONASE) 50 MCG/ACT nasal spray Place into both nostrils daily as needed for allergies or rhinitis.    . valACYclovir (VALTREX) 500 MG tablet     . Vitamin D, Ergocalciferol, (DRISDOL) 50000 units CAPS capsule Take 1 capsule (50,000 Units total) by mouth every 7 (seven) days. 4 capsule 0   No current facility-administered medications on file prior to visit.     PAST MEDICAL HISTORY: Past Medical History:  Diagnosis Date  . Allergy   . Anemia   . 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 Vitamin D deficiency     PAST SURGICAL HISTORY: Past Surgical History:  Procedure Laterality Date  . ABLATION    . lipo sucttion  2013  . WISDOM TOOTH EXTRACTION  1996    SOCIAL HISTORY: Social History  Substance Use Topics  . Smoking status: Never Smoker  .  Smokeless tobacco: Never Used  . Alcohol use No    FAMILY HISTORY: Family History  Problem Relation Age of Onset  . Hypertension Mother   . Hyperlipidemia Mother   . Heart disease Maternal Grandfather   . Arthritis Paternal Grandmother   . Hyperlipidemia Sister     ROS: Review of Systems  Constitutional: Positive for weight loss.  Psychiatric/Behavioral: Positive for depression. Negative for suicidal ideas.    PHYSICAL EXAM: Blood pressure 130/63, pulse 87, temperature 97.8 F (36.6 C), temperature source Oral, height 5' (1.524 m), weight 160 lb (72.6 kg), SpO2 100 %. Body mass index is 31.25 kg/m. Physical Exam  Constitutional: She is oriented to person, place, and time. She appears well-developed and well-nourished.   Cardiovascular: Normal rate.   Pulmonary/Chest: Effort normal.  Musculoskeletal: Normal range of motion.  Neurological: She is oriented to person, place, and time.  Skin: Skin is warm and dry.  Psychiatric: She has a normal mood and affect. Her behavior is normal.  Vitals reviewed.   RECENT LABS AND TESTS: BMET    Component Value Date/Time   NA 140 02/02/2017 0939   K 4.1 02/02/2017 0939   CL 101 02/02/2017 0939   CO2 21 02/02/2017 0939   GLUCOSE 93 02/02/2017 0939   GLUCOSE 84 04/22/2016 0928   BUN 15 02/02/2017 0939   CREATININE 0.57 02/02/2017 0939   CREATININE 0.75 04/22/2016 0928   CALCIUM 9.2 02/02/2017 0939   GFRNONAA 112 02/02/2017 0939   GFRNONAA >89 04/22/2016 0928   GFRAA 129 02/02/2017 0939   GFRAA >89 04/22/2016 0928   Lab Results  Component Value Date   HGBA1C 5.5 02/02/2017   Lab Results  Component Value Date   INSULIN 8.0 02/02/2017   CBC    Component Value Date/Time   WBC 7.5 02/02/2017 0939   WBC 8.1 04/22/2016 0928   RBC 4.14 02/02/2017 0939   RBC 4.15 04/22/2016 0928   HGB 12.1 04/22/2016 0928   HCT 36.1 02/02/2017 0939   PLT 316 04/22/2016 0928   MCV 87 02/02/2017 0939   MCH 28.7 02/02/2017 0939   MCH 29.2 04/22/2016 0928   MCHC 33.0 02/02/2017 0939   MCHC 34.0 04/22/2016 0928   RDW 13.7 02/02/2017 0939   LYMPHSABS 2.7 02/02/2017 0939   EOSABS 0.3 02/02/2017 0939   BASOSABS 0.0 02/02/2017 0939   Iron/TIBC/Ferritin/ %Sat    Component Value Date/Time   IRON 57 02/02/2017 0939   TIBC 322 02/02/2017 0939   FERRITIN 88 02/02/2017 0939   IRONPCTSAT 18 02/02/2017 0939   Lipid Panel     Component Value Date/Time   CHOL 256 (H) 02/02/2017 0939   TRIG 93 02/02/2017 0939   HDL 48 02/02/2017 0939   CHOLHDL 4.5 04/22/2016 0928   VLDL 19 04/22/2016 0928   LDLCALC 189 (H) 02/02/2017 0939   Hepatic Function Panel     Component Value Date/Time   PROT 7.0 02/02/2017 0939   ALBUMIN 4.3 02/02/2017 0939   AST 14 02/02/2017 0939   ALT  17 02/02/2017 0939   ALKPHOS 120 (H) 02/02/2017 0939   BILITOT <0.2 02/02/2017 0939      Component Value Date/Time   TSH 2.670 02/02/2017 0939   TSH 2.00 04/22/2016 0928   TSH 2.724 09/02/2015 1457    ASSESSMENT AND PLAN: Other depression - Plan: topiramate (TOPAMAX) 25 MG tablet  Class 1 obesity without serious comorbidity with body mass index (BMI) of 31.0 to 31.9 in adult, unspecified obesity type  PLAN:  Depression with Emotional Eating Behaviors We discussed behavior modification techniques today to help Brayli deal with her emotional eating and depression. She has agreed to discontinue Wellbutrin SR 150 and start to take Topamax 25 mg every night #30 with no refills and agreed to follow up as directed.  Obesity Giana is currently in the action stage of change. As such, her goal is to continue with weight loss efforts She has agreed to follow the Category 1 plan Ruari has been instructed to work up to a goal of 150 minutes of combined cardio and strengthening exercise per week for weight loss and overall health benefits. We discussed the following Behavioral Modification Strategies today: increasing lean protein intake and work on meal planning and easy cooking plans  Talecia has agreed to follow up with our clinic in 2 weeks. She was informed of the importance of frequent follow up visits to maximize her success with intensive lifestyle modifications for her multiple health conditions.  I, Doreene Nest, am acting as scribe for Dennard Nip, MD  I have reviewed the above documentation for accuracy and completeness, and I agree with the above. -Dennard Nip, MD

## 2017-04-20 ENCOUNTER — Encounter (INDEPENDENT_AMBULATORY_CARE_PROVIDER_SITE_OTHER): Payer: Self-pay

## 2017-04-20 ENCOUNTER — Encounter (INDEPENDENT_AMBULATORY_CARE_PROVIDER_SITE_OTHER): Payer: Self-pay | Admitting: Family Medicine

## 2017-04-20 ENCOUNTER — Ambulatory Visit (INDEPENDENT_AMBULATORY_CARE_PROVIDER_SITE_OTHER): Payer: 59 | Admitting: Family Medicine

## 2017-04-20 NOTE — Telephone Encounter (Signed)
Please r/s

## 2017-04-22 ENCOUNTER — Ambulatory Visit (INDEPENDENT_AMBULATORY_CARE_PROVIDER_SITE_OTHER): Payer: 59 | Admitting: Family Medicine

## 2017-04-22 VITALS — BP 122/78 | HR 86 | Temp 97.8°F | Ht 60.0 in

## 2017-04-22 DIAGNOSIS — Z6831 Body mass index (BMI) 31.0-31.9, adult: Secondary | ICD-10-CM | POA: Diagnosis not present

## 2017-04-22 DIAGNOSIS — Z9189 Other specified personal risk factors, not elsewhere classified: Secondary | ICD-10-CM

## 2017-04-22 DIAGNOSIS — E669 Obesity, unspecified: Secondary | ICD-10-CM | POA: Diagnosis not present

## 2017-04-22 DIAGNOSIS — F3289 Other specified depressive episodes: Secondary | ICD-10-CM | POA: Diagnosis not present

## 2017-04-22 DIAGNOSIS — E559 Vitamin D deficiency, unspecified: Secondary | ICD-10-CM

## 2017-04-22 MED ORDER — VITAMIN D (ERGOCALCIFEROL) 1.25 MG (50000 UNIT) PO CAPS: 50000.0000 [IU] | ORAL_CAPSULE | ORAL | 0 refills | Status: DC

## 2017-04-22 MED ORDER — CETIRIZINE HCL 10 MG PO TABS: 10.0000 mg | ORAL_TABLET | Freq: Every day | ORAL | 0 refills | Status: DC

## 2017-04-22 MED ORDER — BUPROPION HCL ER (SR) 150 MG PO TB12: 150.0000 mg | ORAL_TABLET | Freq: Every day | ORAL | 0 refills | Status: DC

## 2017-04-22 NOTE — Progress Notes (Signed)
Office: [phone]  /  Fax: [phone]   HPI:   Chief Complaint: OBESITY Mackenzie Jones is here to discuss her progress with her obesity treatment plan. She is on the  follow the Category 1 plan and is following her eating plan approximately 50 % of the time. She states she is walking 20 minutes 5 to 6 times per week. Mackenzie Jones maintained weight well, even on vacation. She worked on increasing protein but did do some celebration eating. She still struggled with emotional eating and planning meals ahead of time. She eats out frequently for dinner. Her weight is   today and has maintained weight over a periof of 2 weeks since her last visit. She has lost 13 lbs since starting treatment with [place].  Vitamin D deficiency Mackenzie Jones has a diagnosis of vitamin D deficiency. She is currently stable on vit D and denies nausea, vomiting or muscle weakness.  Depression with emotional eating behaviors Mackenzie Jones started low dose Topamax and felt it made her too sleepy so she stopped. She would like to try to re-start Wellbutrin which she thought may have given her hives in the past. Mackenzie Jones still struggles with emotional eating and using food for comfort to the extent that it is negatively impacting her health. She often snacks when she is not hungry. Mackenzie Jones sometimes feels she is out of control and then feels guilty that she made poor food choices. She has been working on behavior modification techniques to help reduce her emotional eating and has been somewhat successful. She shows no sign of suicidal or homicidal ideations.  At risk for osteopenia Mackenzie Jones is at higher risk of osteopenia and osteoporosis due to vitamin D deficiency.   Depression screen Mackenzie Jones 2/9 02/02/2017 01/18/2017  Decreased Interest 1 0  Down, Depressed, Hopeless 1 0  PHQ - 2 Score 2 0  Altered sleeping 0 -  Tired, decreased energy 2 -  Change in appetite 1 -  Feeling bad or failure about yourself  1 -  Trouble concentrating 1 -  Moving slowly or  fidgety/restless 0 -  Suicidal thoughts 0 -  PHQ-9 Score 7 -      ALLERGIES: Allergies  Allergen Reactions   Flagyl [Metronidazole]    Sulfa Antibiotics     MEDICATIONS: Current Outpatient Prescriptions on File Prior to Visit  Medication Sig Dispense Refill   fluticasone (FLONASE) 50 MCG/ACT nasal spray Place into both nostrils daily as needed for allergies or rhinitis.     topiramate (TOPAMAX) 25 MG tablet Take 1 tablet (25 mg total) by mouth at bedtime. 30 tablet 0   valACYclovir (VALTREX) 500 MG tablet      Vitamin D, Ergocalciferol, (DRISDOL) 50000 units CAPS capsule Take 1 capsule (50,000 Units total) by mouth every 7 (seven) days. 4 capsule 0   No current facility-administered medications on file prior to visit.     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  Gastrointestinal: Negative for nausea and vomiting.  Musculoskeletal:       Negative muscle weakness  Psychiatric/Behavioral: Positive for depression. Negative for suicidal ideas.    PHYSICAL EXAM: Blood pressure 122/78, pulse 86, temperature 97.8 F (36.6 C), temperature source Oral, height 5' (1.524 m), SpO2 98 %. There is no height or weight on file to calculate BMI. Physical Exam    Constitutional: She is oriented to person, place, and time. She appears well-developed and well-nourished.  Cardiovascular: Normal rate.   Pulmonary/Chest: Effort normal.  Musculoskeletal: Normal range of motion.  Neurological: She is oriented to person, place, and time.  Skin: Skin is warm and dry.  Psychiatric: She has a normal mood and affect. Her behavior is normal.  Vitals reviewed.   RECENT LABS AND TESTS: BMET    Component Value Date/Time   NA 140 02/02/2017 0939   K 4.1 02/02/2017 0939   CL 101 02/02/2017 0939   CO2 21 02/02/2017 0939   GLUCOSE 93 02/02/2017 0939   GLUCOSE 84 04/22/2016 0928   BUN 15 02/02/2017 0939   CREATININE 0.57 02/02/2017 0939   CREATININE 0.75 04/22/2016 0928   CALCIUM 9.2 02/02/2017 0939   GFRNONAA 112 02/02/2017 0939   GFRNONAA >89 04/22/2016 0928   GFRAA 129 02/02/2017 0939   GFRAA >89 04/22/2016 0928   Lab Results  Component Value Date   HGBA1C 5.5 02/02/2017   Lab Results  Component Value Date   INSULIN 8.0 02/02/2017   CBC    Component Value Date/Time   WBC 7.5 02/02/2017 0939   WBC 8.1 04/22/2016 0928   RBC 4.14 02/02/2017 0939   RBC 4.15 04/22/2016 0928   HGB 12.1 04/22/2016 0928   HCT 36.1 02/02/2017 0939   PLT 316 04/22/2016 0928   MCV 87 02/02/2017 0939   MCH 28.7 02/02/2017 0939   MCH 29.2 04/22/2016 0928   MCHC 33.0 02/02/2017 0939   MCHC 34.0 04/22/2016 0928   RDW 13.7 02/02/2017 0939   LYMPHSABS 2.7 02/02/2017 0939   EOSABS 0.3 02/02/2017 0939   BASOSABS 0.0 02/02/2017 0939   Iron/TIBC/Ferritin/ %Sat    Component Value Date/Time   IRON 57 02/02/2017 0939   TIBC 322 02/02/2017 0939   FERRITIN 88 02/02/2017 0939   IRONPCTSAT 18 02/02/2017 0939   Lipid Panel     Component Value Date/Time   CHOL 256 (H) 02/02/2017 0939   TRIG 93 02/02/2017 0939   HDL 48 02/02/2017 0939   CHOLHDL 4.5 04/22/2016 0928   VLDL 19 04/22/2016 0928   LDLCALC 189 (H) 02/02/2017 0939   Hepatic Function Panel     Component  Value Date/Time   PROT 7.0 02/02/2017 0939   ALBUMIN 4.3 02/02/2017 0939   AST 14 02/02/2017 0939   ALT 17 02/02/2017 0939   ALKPHOS 120 (H) 02/02/2017 0939   BILITOT <0.2 02/02/2017 0939      Component Value Date/Time   TSH 2.670 02/02/2017 0939   TSH 2.00 04/22/2016 0928   TSH 2.724 09/02/2015 1457    ASSESSMENT AND PLAN: Other depression - Plan: buPROPion (WELLBUTRIN SR) 150 MG 12 hr tablet, cetirizine (ZYRTEC ALLERGY) 10 MG tablet  Vitamin D deficiency - Plan: Vitamin D, Ergocalciferol, (DRISDOL) 50000 units CAPS capsule  At risk for osteoporosis  Class 1 obesity without serious comorbidity with body mass index (BMI) of 31.0 to 31.9 in adult, unspecified obesity type  PLAN:  Vitamin D Deficiency Mackenzie Jones was informed that low vitamin D levels contributes to fatigue and are associated with obesity, breast, and colon cancer. She agrees to continue to take prescription Vit D @50 ,000 IU every week, we will refill for 1 month and will follow up for routine testing of vitamin D, at least 2-3 times per year. She was informed of the risk of over-replacement of vitamin D and agrees to not increase her dose unless he discusses this with [place] first. Mackenzie Jones agrees to follow up with our clinic in 2 weeks.  Depression with Emotional Eating Behaviors We discussed behavior modification techniques today to help Mackenzie Jones deal with her emotional eating and depression. She has agreed to re-start Wellbutrin SR 150 mg every morning #30 with no refills and take OTC Zyrtec at the same time will follow up as directed.  At risk for osteopenia Mackenzie Jones is at risk for osteopenia and osteoporsis due to her vitamin D deficiency. She was encouraged to take her vitamin D and follow her higher calcium diet and increase strengthening exercise to help strengthen her bones and decrease her risk of osteopenia and osteoporosis.  Obesity Mackenzie Jones is currently in the action stage of change. As such, her goal is to continue with  weight loss efforts She has agreed to keep a food journal with 350 to 500 calories and 30+ grams of protein at        daily Khyla has been instructed to work up to a goal of 150 minutes of combined cardio and strengthening exercise per week for weight loss and overall health benefits. We discussed the following Behavioral Modification Strategies today: increasing lean protein intake and work on meal planning and easy cooking plans  Renesmay has agreed to follow up with our clinic in 2 weeks. She was informed of the importance of frequent follow up visits to maximize her success with intensive lifestyle modifications for her multiple health conditions.  I, Doreene Nest, am acting as scribe for Dennard Nip, MD  I have reviewed the above documentation for accuracy and completeness, and I agree with the above. -Dennard Nip, MD  OBESITY BEHAVIORAL INTERVENTION VISIT  Today's visit was # 6 out of 22.  Starting weight: 173 lbs Starting date: 02/02/17 Todays weight : 160 lbs Total lbs lost to date: 13 (Patients must lose 7 lbs in the first 6 months to continue with counseling)   ASK: We discussed the diagnosis of obesity with Mackenzie Jones today and Mackenzie Jones agreed to give [place] permission to discuss obesity behavioral modification therapy today.  ASSESS: Mackenzie Jones has the diagnosis of obesity and her BMI today is 31.3 Mackenzie Jones is in the action stage of change   ADVISE: Mackenzie Jones was educated on the multiple health risks of obesity as well as the benefit of weight loss to improve her health. She was advised of the need for long term treatment and the importance of lifestyle modifications.  AGREE: Multiple dietary modification options and treatment options were discussed and  Mackenzie Jones agreed to keep a food journal with 350 to 500 calories and 30+ grams of protein at dinner     daily We discussed the following Behavioral Modification Strategies today: increasing lean protein intake and work on meal planning and  easy cooking plans

## 2017-04-23 ENCOUNTER — Encounter (INDEPENDENT_AMBULATORY_CARE_PROVIDER_SITE_OTHER): Payer: Self-pay | Admitting: Family Medicine

## 2017-05-06 ENCOUNTER — Ambulatory Visit (INDEPENDENT_AMBULATORY_CARE_PROVIDER_SITE_OTHER): Payer: 59 | Admitting: Family Medicine

## 2017-05-06 VITALS — BP 109/69 | HR 105 | Temp 98.3°F | Ht 60.0 in

## 2017-05-06 DIAGNOSIS — Z6831 Body mass index (BMI) 31.0-31.9, adult: Secondary | ICD-10-CM | POA: Diagnosis not present

## 2017-05-06 DIAGNOSIS — E559 Vitamin D deficiency, unspecified: Secondary | ICD-10-CM | POA: Diagnosis not present

## 2017-05-06 DIAGNOSIS — E669 Obesity, unspecified: Secondary | ICD-10-CM

## 2017-05-06 DIAGNOSIS — F3289 Other specified depressive episodes: Secondary | ICD-10-CM | POA: Diagnosis not present

## 2017-05-06 MED ORDER — VITAMIN D (ERGOCALCIFEROL) 1.25 MG (50000 UNIT) PO CAPS: 50000.0000 [IU] | ORAL_CAPSULE | ORAL | 0 refills | Status: DC

## 2017-05-06 MED ORDER — BUPROPION HCL ER (SR) 200 MG PO TB12: 200.0000 mg | ORAL_TABLET | Freq: Two times a day (BID) | ORAL | 0 refills | Status: DC

## 2017-05-06 NOTE — Progress Notes (Signed)
Office: [phone]  /  Fax: [phone]   HPI:   Chief Complaint: OBESITY Mackenzie Jones is here to discuss her progress with her obesity treatment plan. She is on the  follow the Category 1 plan and is following her eating plan approximately 80 % of the time. She states she is walking for 30 minutes 5 to 6 times per week, home exercise 2 times per week for 30 minutes.Mackenzie Jones has had increased cravings, especially at night. She has been planning ahead well. Her weight is   today and has maintained weight over a period of 2 weeks since her last visit. She has lost 13 lbs since starting treatment with [place].  Vitamin D deficiency Mackenzie Jones has a diagnosis of vitamin D deficiency. She is currently taking vit D and denies nausea, vomiting or muscle weakness.  Depression with emotional eating behaviors Mackenzie Jones is struggling with emotional eating and using food for comfort to the extent that it is negatively impacting her health. She often snacks when she is not hungry. Mackenzie Jones sometimes feels she is out of control and then feels guilty that she made poor food choices. She has been working on behavior modification techniques to help reduce her emotional eating and has been somewhat successful. She shows no sign of suicidal or homicidal ideations.  Depression screen Mackenzie Jones 2/9 02/02/2017 01/18/2017  Decreased Interest 1 0  Down, Depressed, Hopeless 1 0  PHQ - 2 Score 2 0  Altered sleeping 0 -  Tired, decreased energy 2 -  Change in appetite 1 -  Feeling bad or failure about yourself  1 -  Trouble concentrating 1 -  Moving slowly or fidgety/restless 0 -  Suicidal thoughts 0 -  PHQ-9 Score 7 -     ALLERGIES: Allergies  Allergen Reactions   Flagyl [Metronidazole]    Sulfa Antibiotics     MEDICATIONS: Current Outpatient Prescriptions on File Prior to Visit  Medication Sig Dispense Refill   cetirizine (ZYRTEC ALLERGY) 10 MG tablet Take 1 tablet (10 mg total) by mouth daily. 30 tablet 0   fluticasone  (FLONASE) 50 MCG/ACT nasal spray Place into both nostrils daily as needed for allergies or rhinitis.     topiramate (TOPAMAX) 25 MG tablet Take 1 tablet (25 mg total) by mouth at bedtime. 30 tablet 0   valACYclovir (VALTREX) 500 MG tablet      No current facility-administered medications on file prior to visit.     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  Gastrointestinal: Negative for nausea and vomiting.  Musculoskeletal:       Negative muscle weakness  Psychiatric/Behavioral: Positive for depression. Negative for suicidal ideas.    PHYSICAL EXAM:  Blood pressure 109/69, pulse (!) 105, temperature 98.3 F (36.8 C), temperature source Oral, height 5' (1.524 m), SpO2 97 %. There is no height or weight on file to calculate BMI. Physical Exam  Constitutional: She is oriented to person, place, and time. She appears well-developed and well-nourished.  Cardiovascular: Normal rate.   Pulmonary/Chest: Effort normal.  Musculoskeletal: Normal range of motion.  Neurological: She is oriented to person, place, and time.  Skin: Skin is warm and dry.  Vitals reviewed.   RECENT LABS AND TESTS: BMET    Component Value Date/Time   NA 140 02/02/2017 0939   K 4.1 02/02/2017 0939    CL 101 02/02/2017 0939   CO2 21 02/02/2017 0939   GLUCOSE 93 02/02/2017 0939   GLUCOSE 84 04/22/2016 0928   BUN 15 02/02/2017 0939   CREATININE 0.57 02/02/2017 0939   CREATININE 0.75 04/22/2016 0928   CALCIUM 9.2 02/02/2017 0939   GFRNONAA 112 02/02/2017 0939   GFRNONAA >89 04/22/2016 0928   GFRAA 129 02/02/2017 0939   GFRAA >89 04/22/2016 0928   Lab Results  Component Value Date   HGBA1C 5.5 02/02/2017   Lab Results  Component Value Date   INSULIN 8.0 02/02/2017   CBC    Component Value Date/Time   WBC 7.5 02/02/2017 0939   WBC 8.1 04/22/2016 0928   RBC 4.14 02/02/2017 0939   RBC 4.15 04/22/2016 0928   HGB 11.9 02/02/2017 0939   HCT 36.1 02/02/2017 0939   PLT 316 04/22/2016 0928   MCV 87 02/02/2017 0939   MCH 28.7 02/02/2017 0939   MCH 29.2 04/22/2016 0928   MCHC 33.0 02/02/2017 0939   MCHC 34.0 04/22/2016 0928   RDW 13.7 02/02/2017 0939   LYMPHSABS 2.7 02/02/2017 0939   EOSABS 0.3 02/02/2017 0939   BASOSABS 0.0 02/02/2017 0939   Iron/TIBC/Ferritin/ %Sat    Component Value Date/Time   IRON 57 02/02/2017 0939   TIBC 322 02/02/2017 0939   FERRITIN 88 02/02/2017 0939   IRONPCTSAT 18 02/02/2017 0939   Lipid Panel     Component Value Date/Time   CHOL 256 (H) 02/02/2017 0939   TRIG 93 02/02/2017 0939   HDL 48 02/02/2017 0939   CHOLHDL 4.5 04/22/2016 0928   VLDL 19 04/22/2016 0928   LDLCALC 189 (H) 02/02/2017 0939   Hepatic Function Panel     Component Value Date/Time   PROT 7.0 02/02/2017 0939   ALBUMIN 4.3 02/02/2017 0939   AST 14 02/02/2017 0939   ALT 17 02/02/2017 0939   ALKPHOS 120 (H) 02/02/2017 0939   BILITOT <0.2 02/02/2017 0939      Component Value Date/Time   TSH 2.670 02/02/2017 0939   TSH 2.00 04/22/2016 0928   TSH 2.724 09/02/2015 1457    ASSESSMENT AND PLAN: Vitamin D deficiency - Plan: Vitamin D, Ergocalciferol, (DRISDOL) 50000 units CAPS capsule  Other depression - Plan: buPROPion (WELLBUTRIN SR) 200 MG 12 hr tablet  Class 1  obesity without serious comorbidity with body mass index (BMI) of 31.0 to 31.9 in adult, unspecified obesity type  PLAN:  Vitamin D Deficiency Mackenzie Jones was informed that low vitamin D levels contributes to fatigue and are associated with obesity, breast, and colon cancer. She agrees to continue to take prescription Vit D @50 ,000 IU every week, we will refill for 1 month and will follow up for routine testing of vitamin D, at least 2-3 times per year. She was informed of the risk of over-replacement of vitamin D and agrees to  not increase her dose unless he discusses this with [place] first. Mackenzie Jones agrees to follow up with our clinic in 2 to 3 weeks.  Depression with Emotional Eating Behaviors We discussed behavior modification techniques today to help Mckynlie deal with her emotional eating and depression. She has agreed to take Wellbutrin SR 200 mg once daily every morning #30 with no refills and she agreed to follow up as directed.  Obesity [place] is currently in the action stage of change. As such, her goal is to continue with weight loss efforts She has agreed to follow the Category 1 plan [place] has been instructed to work up to a goal of 150 minutes of combined cardio and strengthening exercise per week for weight loss and overall health benefits. We discussed the following Behavioral Modification Strategies today: increasing lean protein intake and emotional eating strategies  Amaliya has agreed to follow up with our clinic in 2 to 3 weeks. She was informed of the importance of frequent follow up visits to maximize her success with intensive lifestyle modifications for her multiple health conditions.  I, Doreene Nest, am acting as scribe for Dennard Nip, MD  I have reviewed the above documentation for accuracy and completeness, and I agree with the above. -Dennard Nip, MD  OBESITY BEHAVIORAL INTERVENTION VISIT  Today's visit was # 7 out of 22.  Starting weight: 173 lbs Starting date:  02/02/17 Today's weight : 160 lbs Today's date: 05/06/2017 Total lbs lost to date: 13 (Patients must lose 7 lbs in the first 6 months to continue with counseling)   ASK: We discussed the diagnosis of obesity with [name] today and Amaranta agreed to give [place] permission to discuss obesity behavioral modification therapy today.  ASSESS: Sylva has the diagnosis of obesity and her BMI today is 31.3 Mckynzi is in the action stage of change   ADVISE: Vickie was educated on the multiple health risks of obesity as well as the benefit of weight loss to improve her health. She was advised of the need for long term treatment and the importance of lifestyle modifications.  AGREE: Multiple dietary modification options and treatment options were discussed and  Akesha agreed to follow the Category 1 plan We discussed the following Behavioral Modification Strategies today: increasing lean protein intake and emotional eating strategies

## 2017-05-13 DIAGNOSIS — J0100 Acute maxillary sinusitis, unspecified: Secondary | ICD-10-CM | POA: Diagnosis not present

## 2017-05-27 ENCOUNTER — Ambulatory Visit (INDEPENDENT_AMBULATORY_CARE_PROVIDER_SITE_OTHER): Payer: 59 | Admitting: Family Medicine

## 2017-08-12 ENCOUNTER — Other Ambulatory Visit: Payer: Self-pay | Admitting: Obstetrics and Gynecology

## 2017-08-12 DIAGNOSIS — Z1231 Encounter for screening mammogram for malignant neoplasm of breast: Secondary | ICD-10-CM

## 2017-10-01 ENCOUNTER — Telehealth: Payer: Self-pay | Admitting: *Deleted

## 2017-10-01 NOTE — Telephone Encounter (Signed)
Patient would need to make an appt to discuss if she desires work up or referral for potential Protein S deficiency.  - She would likely need a referral to hematologist to be appropriately evaluated for this condition. Having her fathers results would also be helpful to guide her evaluation.  - However, short term for her trip next week,  she could make sure to take a baby asa daily. Make sure to get up and walk the aisle every couple hours to keep good circulation.

## 2017-10-01 NOTE — Telephone Encounter (Signed)
Spoke with patient reviewed information and instructions. Patient verbalized understanding.

## 2017-10-01 NOTE — Telephone Encounter (Signed)
Patient called and left message stating her father was recently Dx with protein S def and she is requesting some testing to see if she has this. She states she is traveling out of the country in about a week and is concerned about getting a blood clot from flying. Please advise.

## 2017-10-01 NOTE — Telephone Encounter (Signed)
Patient states she left message with nurse yesterday and has not heard anything back yet.  She would like a call back with status of message today.

## 2017-10-04 MED FILL — PHENTERMINE 37.5 MG TABLET: 37.5 | 30 days supply | Qty: 30 | Fill #0

## 2017-10-19 ENCOUNTER — Ambulatory Visit (INDEPENDENT_AMBULATORY_CARE_PROVIDER_SITE_OTHER): Payer: 59 | Admitting: Family Medicine

## 2017-10-19 ENCOUNTER — Encounter: Payer: Self-pay | Admitting: Family Medicine

## 2017-10-19 VITALS — BP 118/76 | HR 72 | Temp 98.6°F | Resp 20 | Ht 60.0 in | Wt 168.5 lb

## 2017-10-19 DIAGNOSIS — Z8249 Family history of ischemic heart disease and other diseases of the circulatory system: Secondary | ICD-10-CM | POA: Diagnosis not present

## 2017-10-19 DIAGNOSIS — Z832 Family history of diseases of the blood and blood-forming organs and certain disorders involving the immune mechanism: Secondary | ICD-10-CM | POA: Diagnosis not present

## 2017-10-19 DIAGNOSIS — E559 Vitamin D deficiency, unspecified: Secondary | ICD-10-CM

## 2017-10-19 DIAGNOSIS — E785 Hyperlipidemia, unspecified: Secondary | ICD-10-CM

## 2017-10-19 DIAGNOSIS — Z0000 Encounter for general adult medical examination without abnormal findings: Secondary | ICD-10-CM

## 2017-10-19 DIAGNOSIS — E669 Obesity, unspecified: Secondary | ICD-10-CM

## 2017-10-19 LAB — CBC WITH DIFFERENTIAL/PLATELET
Basophils Absolute: 0.1 10*3/uL (ref 0.0–0.1)
Basophils Relative: 1.1 % (ref 0.0–3.0)
EOS PCT: 0.8 % (ref 0.0–5.0)
Eosinophils Absolute: 0.1 10*3/uL (ref 0.0–0.7)
HCT: 39.8 % (ref 36.0–46.0)
HEMOGLOBIN: 13.1 g/dL (ref 12.0–15.0)
LYMPHS ABS: 3.8 10*3/uL (ref 0.7–4.0)
Lymphocytes Relative: 45 % (ref 12.0–46.0)
MCHC: 33 g/dL (ref 30.0–36.0)
MCV: 89.7 fl (ref 78.0–100.0)
MONO ABS: 0.5 10*3/uL (ref 0.1–1.0)
MONOS PCT: 5.6 % (ref 3.0–12.0)
Neutro Abs: 4 10*3/uL (ref 1.4–7.7)
Neutrophils Relative %: 47.5 % (ref 43.0–77.0)
Platelets: 335 10*3/uL (ref 150.0–400.0)
RBC: 4.44 Mil/uL (ref 3.87–5.11)
RDW: 12.9 % (ref 11.5–15.5)
WBC: 8.5 10*3/uL (ref 4.0–10.5)

## 2017-10-19 LAB — LIPID PANEL
CHOL/HDL RATIO: 5
CHOLESTEROL: 233 mg/dL — AB (ref 0–200)
HDL: 50.5 mg/dL (ref >39.00)
LDL Cholesterol: 160 mg/dL — ABNORMAL HIGH (ref 0–99)
NonHDL: 182.53
TRIGLYCERIDES: 113 mg/dL (ref 0.0–149.0)
VLDL: 22.6 mg/dL (ref 0.0–40.0)

## 2017-10-19 LAB — COMPREHENSIVE METABOLIC PANEL
ALBUMIN: 4.3 g/dL (ref 3.5–5.2)
ALK PHOS: 89 U/L (ref 39–117)
ALT: 12 U/L (ref 0–35)
AST: 14 U/L (ref 0–37)
BUN: 16 mg/dL (ref 6–23)
CALCIUM: 9.5 mg/dL (ref 8.4–10.5)
CHLORIDE: 106 meq/L (ref 96–112)
CO2: 27 mEq/L (ref 19–32)
Creatinine, Ser: 0.87 mg/dL (ref 0.40–1.20)
GFR: 89.66 mL/min (ref >60.00)
Glucose, Bld: 101 mg/dL — ABNORMAL HIGH (ref 70–99)
POTASSIUM: 3.9 meq/L (ref 3.5–5.1)
SODIUM: 139 meq/L (ref 135–145)
Total Bilirubin: 0.6 mg/dL (ref 0.2–1.2)
Total Protein: 6.9 g/dL (ref 6.0–8.3)

## 2017-10-19 LAB — TSH: TSH: 3.66 u[IU]/mL (ref 0.35–4.50)

## 2017-10-19 LAB — HEMOGLOBIN A1C: Hgb A1c MFr Bld: 5.3 % (ref 4.6–6.5)

## 2017-10-19 LAB — VITAMIN D 25 HYDROXY (VIT D DEFICIENCY, FRACTURES): VITD: 19.65 ng/mL — AB (ref 30.00–100.00)

## 2017-10-19 NOTE — Patient Instructions (Signed)
It was great to see you today. I hope you have a wonderful holiday.   Health Maintenance, Female Adopting a healthy lifestyle and getting preventive care can go a long way to promote health and wellness. Talk with your health care provider about what schedule of regular examinations is right for you. This is a good chance for you to check in with your provider about disease prevention and staying healthy. In between checkups, there are plenty of things you can do on your own. Experts have done a lot of research about which lifestyle changes and preventive measures are most likely to keep you healthy. Ask your health care provider for more information. Weight and diet Eat a healthy diet  Be sure to include plenty of vegetables, fruits, low-fat dairy products, and lean protein.  Do not eat a lot of foods high in solid fats, added sugars, or salt.  Get regular exercise. This is one of the most important things you can do for your health. ? Most adults should exercise for at least 150 minutes each week. The exercise should increase your heart rate and make you sweat (moderate-intensity exercise). ? Most adults should also do strengthening exercises at least twice a week. This is in addition to the moderate-intensity exercise.  Maintain a healthy weight  Body mass index (BMI) is a measurement that can be used to identify possible weight problems. It estimates body fat based on height and weight. Your health care provider can help determine your BMI and help you achieve or maintain a healthy weight.  For females 5 years of age and older: ? A BMI below 18.5 is considered underweight. ? A BMI of 18.5 to 24.9 is normal. ? A BMI of 25 to 29.9 is considered overweight. ? A BMI of 30 and above is considered obese.  Watch levels of cholesterol and blood lipids  You should start having your blood tested for lipids and cholesterol at 48 years of age, then have this test every 5 years.  You may need  to have your cholesterol levels checked more often if: ? Your lipid or cholesterol levels are high. ? You are older than 47 years of age. ? You are at high risk for heart disease.  Cancer screening Lung Cancer  Lung cancer screening is recommended for adults 12-54 years old who are at high risk for lung cancer because of a history of smoking.  A yearly low-dose CT scan of the lungs is recommended for people who: ? Currently smoke. ? Have quit within the past 15 years. ? Have at least a 30-pack-year history of smoking. A pack year is smoking an average of one pack of cigarettes a day for 1 year.  Yearly screening should continue until it has been 15 years since you quit.  Yearly screening should stop if you develop a health problem that would prevent you from having lung cancer treatment.  Breast Cancer  Practice breast self-awareness. This means understanding how your breasts normally appear and feel.  It also means doing regular breast self-exams. Let your health care provider know about any changes, no matter how small.  If you are in your 20s or 30s, you should have a clinical breast exam (CBE) by a health care provider every 1-3 years as part of a regular health exam.  If you are 80 or older, have a CBE every year. Also consider having a breast X-ray (mammogram) every year.  If you have a family history of breast cancer,  talk to your health care provider about genetic screening.  If you are at high risk for breast cancer, talk to your health care provider about having an MRI and a mammogram every year.  Breast cancer gene (BRCA) assessment is recommended for women who have family members with BRCA-related cancers. BRCA-related cancers include: ? Breast. ? Ovarian. ? Tubal. ? Peritoneal cancers.  Results of the assessment will determine the need for genetic counseling and BRCA1 and BRCA2 testing.  Cervical Cancer Your health care provider may recommend that you be  screened regularly for cancer of the pelvic organs (ovaries, uterus, and vagina). This screening involves a pelvic examination, including checking for microscopic changes to the surface of your cervix (Pap test). You may be encouraged to have this screening done every 3 years, beginning at age 21.  For women ages 29-65, health care providers may recommend pelvic exams and Pap testing every 3 years, or they may recommend the Pap and pelvic exam, combined with testing for human papilloma virus (HPV), every 5 years. Some types of HPV increase your risk of cervical cancer. Testing for HPV may also be done on women of any age with unclear Pap test results.  Other health care providers may not recommend any screening for nonpregnant women who are considered low risk for pelvic cancer and who do not have symptoms. Ask your health care provider if a screening pelvic exam is right for you.  If you have had past treatment for cervical cancer or a condition that could lead to cancer, you need Pap tests and screening for cancer for at least 20 years after your treatment. If Pap tests have been discontinued, your risk factors (such as having a new sexual partner) need to be reassessed to determine if screening should resume. Some women have medical problems that increase the chance of getting cervical cancer. In these cases, your health care provider may recommend more frequent screening and Pap tests.  Colorectal Cancer  This type of cancer can be detected and often prevented.  Routine colorectal cancer screening usually begins at 47 years of age and continues through 47 years of age.  Your health care provider may recommend screening at an earlier age if you have risk factors for colon cancer.  Your health care provider may also recommend using home test kits to check for hidden blood in the stool.  A small camera at the end of a tube can be used to examine your colon directly (sigmoidoscopy or colonoscopy).  This is done to check for the earliest forms of colorectal cancer.  Routine screening usually begins at age 92.  Direct examination of the colon should be repeated every 5-10 years through 47 years of age. However, you may need to be screened more often if early forms of precancerous polyps or small growths are found.  Skin Cancer  Check your skin from head to toe regularly.  Tell your health care provider about any new moles or changes in moles, especially if there is a change in a mole's shape or color.  Also tell your health care provider if you have a mole that is larger than the size of a pencil eraser.  Always use sunscreen. Apply sunscreen liberally and repeatedly throughout the day.  Protect yourself by wearing long sleeves, pants, a wide-brimmed hat, and sunglasses whenever you are outside.  Heart disease, diabetes, and high blood pressure  High blood pressure causes heart disease and increases the risk of stroke. High blood pressure is  more likely to develop in: ? People who have blood pressure in the high end of the normal range (130-139/85-89 mm Hg). ? People who are overweight or obese. ? People who are African American.  If you are 47-80 years of age, have your blood pressure checked every 3-5 years. If you are 94 years of age or older, have your blood pressure checked every year. You should have your blood pressure measured twice-once when you are at a hospital or clinic, and once when you are not at a hospital or clinic. Record the average of the two measurements. To check your blood pressure when you are not at a hospital or clinic, you can use: ? An automated blood pressure machine at a pharmacy. ? A home blood pressure monitor.  If you are between 34 years and 10 years old, ask your health care provider if you should take aspirin to prevent strokes.  Have regular diabetes screenings. This involves taking a blood sample to check your fasting blood sugar level. ? If  you are at a normal weight and have a low risk for diabetes, have this test once every three years after 47 years of age. ? If you are overweight and have a high risk for diabetes, consider being tested at a younger age or more often. Preventing infection Hepatitis B  If you have a higher risk for hepatitis B, you should be screened for this virus. You are considered at high risk for hepatitis B if: ? You were born in a country where hepatitis B is common. Ask your health care provider which countries are considered high risk. ? Your parents were born in a high-risk country, and you have not been immunized against hepatitis B (hepatitis B vaccine). ? You have HIV or AIDS. ? You use needles to inject street drugs. ? You live with someone who has hepatitis B. ? You have had sex with someone who has hepatitis B. ? You get hemodialysis treatment. ? You take certain medicines for conditions, including cancer, organ transplantation, and autoimmune conditions.  Hepatitis C  Blood testing is recommended for: ? Everyone born from [birth year] through 1966. ? Anyone with known risk factors for hepatitis C.  Sexually transmitted infections (STIs)  You should be screened for sexually transmitted infections (STIs) including gonorrhea and chlamydia if: ? You are sexually active and are younger than 47 years of age. ? You are older than 47 years of age and your health care provider tells you that you are at risk for this type of infection. ? Your sexual activity has changed since you were last screened and you are at an increased risk for chlamydia or gonorrhea. Ask your health care provider if you are at risk.  If you do not have HIV, but are at risk, it may be recommended that you take a prescription medicine daily to prevent HIV infection. This is called pre-exposure prophylaxis (PrEP). You are considered at risk if: ? You are sexually active and do not regularly use condoms or know the HIV status of your  partner(s). ? You take drugs by injection. ? You are sexually active with a partner who has HIV.  Talk with your health care provider about whether you are at high risk of being infected with HIV. If you choose to begin PrEP, you should first be tested for HIV. You should then be tested every 3 months for as long as you are taking PrEP. Pregnancy  If you are premenopausal and you  may become pregnant, ask your health care provider about preconception counseling.  If you may become pregnant, take 400 to 800 micrograms (mcg) of folic acid every day.  If you want to prevent pregnancy, talk to your health care provider about birth control (contraception). Osteoporosis and menopause  Osteoporosis is a disease in which the bones lose minerals and strength with aging. This can result in serious bone fractures. Your risk for osteoporosis can be identified using a bone density scan.  If you are 59 years of age or older, or if you are at risk for osteoporosis and fractures, ask your health care provider if you should be screened.  Ask your health care provider whether you should take a calcium or vitamin D supplement to lower your risk for osteoporosis.  Menopause may have certain physical symptoms and risks.  Hormone replacement therapy may reduce some of these symptoms and risks. Talk to your health care provider about whether hormone replacement therapy is right for you. Follow these instructions at home:  Schedule regular health, dental, and eye exams.  Stay current with your immunizations.  Do not use any tobacco products including cigarettes, chewing tobacco, or electronic cigarettes.  If you are pregnant, do not drink alcohol.  If you are breastfeeding, limit how much and how often you drink alcohol.  Limit alcohol intake to no more than 1 drink per day for nonpregnant women. One drink equals 12 ounces of beer, 5 ounces of wine, or 1 ounces of hard liquor.  Do not use street  drugs.  Do not share needles.  Ask your health care provider for help if you need support or information about quitting drugs.  Tell your health care provider if you often feel depressed.  Tell your health care provider if you have ever been abused or do not feel safe at home. This information is not intended to replace advice given to you by your health care provider. Make sure you discuss any questions you have with your health care provider. Document Released: 06/01/2011 Document Revised: 04/23/2016 Document Reviewed: 08/20/2015 Elsevier Interactive Patient Education  [employer].

## 2017-10-19 NOTE — Progress Notes (Signed)
Patient ID: Mackenzie Jones, female  DOB: 07-Mar-1971, 46 y.o.   MRN: [id number] Patient Care Team    Relationship Specialty Notifications Start End  Ma Hillock, DO PCP - General Family Medicine  01/18/17    Comment: patient request transfer to [facility]  Ena Dawley, MD Consulting Physician Obstetrics and Gynecology  01/18/17   Nat Christen, MD Attending Physician Optometry  01/18/17     Chief Complaint  Patient presents with  . Annual Exam    Subjective:  Mackenzie Jones is a 46 y.o.  Female  present for CPE. All past medical history, surgical history, allergies, family history, immunizations, medications and social history were obtained in the electronic medical record today. All recent labs, ED visits and hospitalizations within the last year were reviewed. Pts father has been diagnosed with C&S deficiency and she has concerns for herself and potential diagmosis. She does not know his results.  Health maintenance:  Colonoscopy: No Fhx, screen at 49. Mammogram: completed: 10/2016, birads 1. [facility] Center--> scheduled in 10/2017. Cervical cancer screening: last pap: 2016 Immunizations: tdap 2017 UTD, Influenza 08/2017 (encouraged yearly) Infectious disease screening: HIV declined Assistive device: none Oxygen PNT:IRWE Patient has a Dental home. Hospitalizations/ED visits: none  Depression screen [facility] 2/9 10/19/2017 02/02/2017 01/18/2017  Decreased Interest 0 1 0  Down, Depressed, Hopeless 0 1 0  PHQ - 2 Score 0 2 0  Altered sleeping 0 0 -  Tired, decreased energy 0 2 -  Change in appetite 0 1 -  Feeling bad or failure about yourself  0 1 -  Trouble concentrating 0 1 -  Moving slowly or fidgety/restless 0 0 -  Suicidal thoughts 0 0 -  PHQ-9 Score 0 7 -   No flowsheet data found.   Current Exercise Habits: The patient does not participate in regular exercise at present Exercise limited by: None identified   Immunization History  Administered Date(s) Administered    . Tdap 04/22/2016     Past Medical History:  Diagnosis Date  . Allergy   . Anemia   . G6PD deficiency ([place])    Pt reported histroy from when she was in the [employer]. No records.   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 Vitamin D deficiency    Allergies  Allergen Reactions  . Flagyl [Metronidazole]   . Sulfa Antibiotics    Past Surgical History:  Procedure Laterality Date  . ABLATION    . lipo sucttion  2013  . WISDOM TOOTH EXTRACTION  1996   Family History  Problem Relation Age of Onset  . Hypertension Mother   . Hyperlipidemia Mother   . Protein S deficiency Father   . Heart disease Maternal Grandfather   . Arthritis Paternal Grandmother   . Hyperlipidemia Sister    Social History   Socioeconomic History  . Marital status: Married    Spouse name: brian  . Number of children: 2  . Years of education: Masters  . Highest education level: Not on file  Social Needs  . Financial resource strain: Not on file  . Food insecurity - worry: Not on file  . Food insecurity - inability: Not on file  . Transportation needs - medical: Not on file  . Transportation needs - non-medical: Not on file  Occupational History  . Occupation: Chief Financial Officer  Tobacco Use  . Smoking status: Never Smoker  . Smokeless tobacco: Never Used  Substance and Sexual Activity  . Alcohol use:  No  . Drug use: No  . Sexual activity: Yes    Partners: Male    Birth control/protection: Surgical    Comment: vas  Other Topics Concern  . Not on file  Social History Narrative   Married to Kirk. They have 2 daughters Junie [place] and [place]).   Master's in Engineer, production. Works full time.   Drinks caffeine, takes a daily vitamin.    Exercises routinely.   Smoke detector in the home.   Firearms locked in the home.    Feels safe in her relationships.    Allergies as of 10/19/2017      Reactions   Flagyl [metronidazole]    Sulfa Antibiotics       Medication List        Accurate as  of 10/19/17  9:14 AM. Always use your most recent med list.          cetirizine 10 MG tablet Commonly known as:  ZYRTEC ALLERGY Take 1 tablet (10 mg total) by mouth daily.   fluticasone 50 MCG/ACT nasal spray Commonly known as:  FLONASE Place into both nostrils daily as needed for allergies or rhinitis.   valACYclovir 500 MG tablet Commonly known as:  VALTREX       All past medical history, surgical history, allergies, family history, immunizations andmedications were updated in the EMR today and reviewed under the history and medication portions of their EMR.     No results found for this or any previous visit (from the past 2160 hour(s)).  Mm Digital Screening Bilateral  Result Date: 11/02/2016 CLINICAL DATA:  Screening. EXAM: DIGITAL SCREENING BILATERAL MAMMOGRAM WITH CAD COMPARISON:  Previous exam(s). ACR Breast Density Category b: There are scattered areas of fibroglandular density. FINDINGS: There are no findings suspicious for malignancy. Images were processed with CAD. IMPRESSION: No mammographic evidence of malignancy. A result letter of this screening mammogram will be mailed directly to the patient. RECOMMENDATION: Screening mammogram in one year. (Code:SM-B-01Y) BI-RADS CATEGORY  1: Negative. Electronically Signed   By: Margarette Canada M.D.   On: 11/03/2016 08:24     ROS: 14 pt review of systems performed and negative (unless mentioned in an HPI)  Objective: BP 118/76 (BP Location: Left Arm, Patient Position: Sitting, Cuff Size: Large)   Pulse 72   Temp 98.6 F (37 C)   Resp 20   Ht 5' (1.524 m)   Wt 168 lb 8 oz (76.4 kg)   SpO2 97%   BMI 32.91 kg/m  Gen: Afebrile. No acute distress. Nontoxic in appearance, well-developed, well-nourished,  Pleasant obese AAF HENT: AT. [place]. Bilateral TM visualized and normal in appearance, normal external auditory canal. MMM, no oral lesions, adequate dentition. Bilateral nares within normal limits. Throat without erythema,  ulcerations or exudates. no Cough on exam, no hoarseness on exam. Eyes:Pupils Equal Round Reactive to light, Extraocular movements intact,  Conjunctiva without redness, discharge or icterus. Neck/lymp/endocrine: Supple,no lymphadenopathy, no thyromegaly CV: RRR no murmur, noedema, +2/4 P posterior tibialis pulses.  no carotid bruits. No JVD. Chest: CTAB, no wheeze, rhonchi or crackles. normal Respiratory effort. good Air movement. Abd: Soft. obese. NTND. BS present. no Masses palpated. No hepatosplenomegaly. No rebound tenderness or guarding. Skin: no rashes, purpura or petechiae. Warm and well-perfused. Skin intact. Neuro/Msk: Normal gait. PERLA. EOMi. Alert. Oriented x3.  Cranial nerves II through XII intact. Muscle strength 5/5 upper/lower extremity. DTRs equal bilaterally. Psych: Normal affect, dress and demeanor. Normal speech. Normal thought content and judgment.   No exam data  present  Assessment/plan: Mackenzie Jones is a 46 y.o. female present for CPE. Dyslipidemia/Obesity Diet an exercise modifications discussed.  - CBC w/Diff - Comp Met (CMET) - TSH - Vitamin D (25 hydroxy) - Lipid panel - HgB A1c  Vitamin D deficiency Currently not on supplement.  - Vitamin D (25 hydroxy)  Family history of protein S deficiency - referral to hem/onc placed today  Encounter for preventive health examination Patient was encouraged to exercise greater than 150 minutes a week. Patient was encouraged to choose a diet filled with fresh fruits and vegetables, and lean meats. AVS provided to patient today for education/recommendation on gender specific health and safety maintenance. Colonoscopy: No Fhx, screen at 49. Mammogram: completed: 10/2016, birads 1. Breast Center--> scheduled in 10/2017. Cervical cancer screening: last pap: 2016--> Dr Raphael Gibney, records requested again. Immunizations: tdap 2017 UTD, Influenza 08/2017 (encouraged yearly) Infectious disease screening: HIV declined  (including future)  Return in about 1 year (around 10/19/2018) for CPE.  Electronically signed by: Howard Pouch, DO [place]

## 2017-10-20 ENCOUNTER — Telehealth: Payer: Self-pay | Admitting: Family Medicine

## 2017-10-20 DIAGNOSIS — E782 Mixed hyperlipidemia: Secondary | ICD-10-CM

## 2017-10-20 MED ORDER — VITAMIN D (ERGOCALCIFEROL) 1.25 MG (50000 UNIT) PO CAPS: 50000.0000 [IU] | ORAL_CAPSULE | ORAL | 0 refills | Status: DC

## 2017-10-20 NOTE — Telephone Encounter (Signed)
Spoke with patient reviewed lab results and instructions. Patient verbalized understanding.patient willing to try atorvastatin

## 2017-10-20 NOTE — Telephone Encounter (Signed)
Please call pt: - her vit D is still rather low (19). I have called in the supplement to be taken weekly with food. She should also take 1000u OTC vit d daily (now and after completion of prescribed).  Her cholesterol mildly improved from prior collection, but is still rather elevated. I do recommend a low dose prescribed cholesterol medication called a atorvastatin. If she is agreeable I will call this in for her on Monday. (also if agreeable will need f/u appt in 3 months). Of course low saturated fat diet, higher fiber and exercise is also helpful in lowering cholesterol levels    Lipid Panel     Component Value Date/Time   CHOL 233 (H) 10/19/2017 0905   CHOL 256 (H) 02/02/2017 0939   TRIG 113.0 10/19/2017 0905   HDL 50.50 10/19/2017 0905   HDL 48 02/02/2017 0939   CHOLHDL 5 10/19/2017 0905   VLDL 22.6 10/19/2017 0905   LDLCALC 160 (H) 10/19/2017 0905   LDLCALC 189 (H) 02/02/2017 3007

## 2017-10-25 MED ORDER — ATORVASTATIN CALCIUM 20 MG PO TABS: 20.0000 mg | ORAL_TABLET | Freq: Every day | ORAL | 3 refills | Status: DC

## 2017-10-25 NOTE — Telephone Encounter (Signed)
Spoke with patient reviewed lab results and instructions. Patient verbalized understanding. 

## 2017-10-25 NOTE — Addendum Note (Signed)
Addended by: Howard Pouch A on: 10/25/2017 10:45 AM   Modules accepted: Orders

## 2017-10-25 NOTE — Telephone Encounter (Signed)
lipitor prescribed, please make sure pt has follow with provider scheduled in 3 months and she should be fasting.

## 2017-10-27 ENCOUNTER — Encounter: Payer: Self-pay | Admitting: Family Medicine

## 2017-10-27 ENCOUNTER — Telehealth: Payer: Self-pay | Admitting: Family Medicine

## 2017-10-27 NOTE — Telephone Encounter (Signed)
Spoke with patient she states the tech that did her study tole her she had Thoracic Outlet syndrome . Explained to patient study read by the Dr said normal study. She states she is having pain in her arm. Advised patient she would need to make an appt with Dr Raoul Pitch for evaluation so she can determine best treatment options with patient. She verbalized understanding.

## 2017-10-27 NOTE — Telephone Encounter (Signed)
Please call pt and clarify her request sent via echart. - the vascualr studies from 2016 were normal by report. She saw Dr. Sherene Sires.  - Who is Dr. Lawson Fiscal and what is the treatment she is referring?   Her message: Do you have access to my test results from the vascular ultrasound I had performed at [facility] @ 10-11-15 when I was diagnosed with Thoracic Outlet Syndrome? I will need a referral to see Dr. Lawson Fiscal to start a treatment plan for this. My cell is [phone].    Thanks   [employer]

## 2017-11-01 ENCOUNTER — Ambulatory Visit (INDEPENDENT_AMBULATORY_CARE_PROVIDER_SITE_OTHER): Payer: 59 | Admitting: Family Medicine

## 2017-11-01 ENCOUNTER — Encounter: Payer: Self-pay | Admitting: Family Medicine

## 2017-11-01 VITALS — BP 123/76 | HR 91 | Temp 97.5°F | Ht 60.0 in | Wt 169.0 lb

## 2017-11-01 DIAGNOSIS — M79601 Pain in right arm: Secondary | ICD-10-CM

## 2017-11-01 DIAGNOSIS — G8929 Other chronic pain: Secondary | ICD-10-CM

## 2017-11-01 DIAGNOSIS — M25511 Pain in right shoulder: Secondary | ICD-10-CM

## 2017-11-01 MED ORDER — MELOXICAM 15 MG PO TABS: 15.0000 mg | ORAL_TABLET | Freq: Every day | ORAL | 1 refills | Status: DC

## 2017-11-01 NOTE — Progress Notes (Signed)
Mackenzie Jones , 05-Mar-1971, 46 y.o., female MRN: [id number] Patient Care Team    Relationship Specialty Notifications Start End  Ma Hillock, DO PCP - General Family Medicine  01/18/17    Comment: patient request transfer to [facility]  Ena Dawley, MD Consulting Physician Obstetrics and Gynecology  01/18/17   Nat Christen, MD Attending Physician Optometry  01/18/17     Chief Complaint  Patient presents with  . Arm Pain    right radiates from shoulder down     Subjective: Pt presents for an OV with complaints of right arm discomfort of 6 months duration.  Associated symptoms include a sensation of "fatigue " , dull soreness and discomfort with certain range of motion affecting her right shoulder and right upper extremity, sparing hand. She reports extending her arm behind her and rotating her arm toward her body is uncomfortable and reproduces the symptoms. She occasionally hears clicking in her shoulder with ranges of motion. She denies numbness, tingling or weakness. She denies neck injury, shoulder injury, increase in activity or surgery to shoulder/neck. She had similar symptoms November 2016 and had a full workup that included x-ray of cervical spine, right forearm, right humerus, arterial duplex and ultrasound right upper forearm , normal and without cause of symptoms minus a small osteophyte at C6 on cervical spine x-ray. Dedicated shoulder x-rays were not completed at time, I reviewed notes appears patient presented with more concerns of right arm claudication symptoms then and not shoulder discomfort.  Symptoms had resolved on their own within a few months after workup in 2016 until about 6 months ago. Rheumatoid , ANA, sedimentation rate, CK, TSH, C-reactive protein, CMP and CBC were also collected at that time mildly elevated alkaline phosphatase 117, RA latex turbid 58 (elevated), ANA negative, CK normal, TSH normal, CRP normal and sedimentation rate normal.   She also complains  of one week duration warmth sensation intermittently in left great toe. She denies any lower back injury. Increased activity. New shoes etc. She denies any pain, numbness or tingling. She denies any temperature change to touch.  Pt has tried Advil to ease their symptoms.  Cervical spine x-ray 10/01/2015: FINDINGS: The cervical vertebral bodies are preserved in height. The disc space heights are well maintained. There is a small anterior endplate osteophyte at C6. There is no perched facet. The spinous processes are intact. The oblique views reveal no bony encroachment upon the neural foramina. The odontoid is intact. There are no abnormal calcifications in region of the carotid arteries in the neck. IMPRESSION: There is no acute or significant chronic bony abnormality of the cervical spine.  Right forearm x-ray 10/01/2015: FINDINGS: The right radius and ulna are adequately mineralized. There is no lytic or blastic lesion or periosteal reaction. The observed portions of the elbow and wrist exhibit no acute abnormalities. The soft tissues are unremarkable. IMPRESSION: There is no acute or chronic bony abnormality of the right radius or Ulna.  Right humerus x-ray 10/01/2015: FINDINGS: Osseous mineralization normal. Joint spaces preserved. No fracture, dislocation, or bone destruction. IMPRESSION: Normal exam.  Vascular ultrasound upper extremity 10/08/2015: Arterial duplex:  Duplex imaging of the right upper extremity reveals the deep and superficial veins throughout to be easily compressible without intraluminal thrombus. Technologist Notes Patent right upper extremity deep and superficial veins, without evidence of intraluminal thrombus. Vascular ultrasound 10/11/2015: Summary: Normal waveforms throughout. Thoracic outlet syndrome is not diagnosed by non-invasive studies  Depression screen [facility] 2/9 10/19/2017 02/02/2017 01/18/2017  Decreased  Interest 0 1 0  Down, Depressed,  Hopeless 0 1 0  PHQ - 2 Score 0 2 0  Altered sleeping 0 0 -  Tired, decreased energy 0 2 -  Change in appetite 0 1 -  Feeling bad or failure about yourself  0 1 -  Trouble concentrating 0 1 -  Moving slowly or fidgety/restless 0 0 -  Suicidal thoughts 0 0 -  PHQ-9 Score 0 7 -    Allergies  Allergen Reactions  . Flagyl [Metronidazole]   . Sulfa Antibiotics    Social History   Tobacco Use  . Smoking status: Never Smoker  . Smokeless tobacco: Never Used  Substance Use Topics  . Alcohol use: No   Past Medical History:  Diagnosis Date  . Allergy   . Anemia   . G6PD deficiency ([place])    Pt reported histroy from when she was in the [employer]. No records.   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 Vitamin D deficiency    Past Surgical History:  Procedure Laterality Date  . ABLATION    . lipo sucttion  2013  . WISDOM TOOTH EXTRACTION  1996   Family History  Problem Relation Age of Onset  . Hypertension Mother   . Hyperlipidemia Mother   . Protein S deficiency Father   . Heart disease Maternal Grandfather   . Arthritis Paternal Grandmother   . Hyperlipidemia Sister    Allergies as of 11/01/2017      Reactions   Flagyl [metronidazole]    Sulfa Antibiotics       Medication List        Accurate as of 11/01/17  9:50 AM. Always use your most recent med list.          atorvastatin 20 MG tablet Commonly known as:  LIPITOR Take 1 tablet (20 mg total) by mouth daily.   cetirizine 10 MG tablet Commonly known as:  ZYRTEC ALLERGY Take 1 tablet (10 mg total) by mouth daily.   fluticasone 50 MCG/ACT nasal spray Commonly known as:  FLONASE Place into both nostrils daily as needed for allergies or rhinitis.   valACYclovir 500 MG tablet Commonly known as:  VALTREX   Vitamin D (Ergocalciferol) 50000 units Caps capsule Commonly known as:  DRISDOL Take 1 capsule (50,000 Units total) by mouth every 7 (seven) days.       All past medical history,  surgical history, allergies, family history, immunizations andmedications were updated in the EMR today and reviewed under the history and medication portions of their EMR.     ROS: Negative, with the exception of above mentioned in HPI   Objective:  BP 123/76 (BP Location: Left Arm, Patient Position: Sitting, Cuff Size: Normal)   Pulse 91   Temp (!) 97.5 F (36.4 C)   Ht 5' (1.524 m)   Wt 169 lb (76.7 kg)   SpO2 99%   BMI 33.01 kg/m  Body mass index is 33.01 kg/m. Gen: Afebrile. No acute distress. Nontoxic in appearance, well developed, well nourished. Very pleasant African-American female. HENT: AT. [place]. MMM, no oral lesions.  Eyes:Pupils Equal Round Reactive to light, Extraocular movements intact,  Conjunctiva without redness, discharge or icterus. Neck/lymp/endocrine: Supple, no lymphadenopathy CV: RRR, no edema Chest: CTAB, no wheeze or crackles.  Skin: No rashes, purpura or petechiae.  Neuro/MSK:  Normal gait. Normal range of motion of cervical spine without discomfort. Normal range of motion bilateral arms with discomfort in extension and  internal rotation of right arm. Muscle strength 5/5 bilateral upper ext. Negative Hawkins, negative empty can test, Neg push off test, Neg apprehension test, discomfort with O'Brien's. NV intact distally.  Psych: Normal affect, dress and demeanor. Normal speech. Normal thought content and judgment.  No exam data present No results found. No results found for this or any previous visit (from the past 24 hour(s)).  Assessment/Plan: Caprice Mccaffrey is a 46 y.o. female present for OV for  Chronic right shoulder pain/right arm pain - Patient's initial presentation 2 years ago with negative workup, with the exception of small osteophyte at C6. There was concern for thoracic outlet syndrome at that time, however after workup patient reports her symptoms resolved until about 6 months ago. She had also seen a rheumatologist at that time, given her  rheumatoid with mildly elevated, she reports rheumatology did not feel her symptoms were related to arthritis. - Discussed options with patient today which included OMT, steroid injection, ortho referral, additional imaging studies dedicated toward cervical spine and shoulder. Patient would like to try more conservative therapy with continuing NSAIDs, referral for OMT if appropriate after exam and possible steroid injection. She is aware she may need to undergo further imaging studies and concern for shoulder joint injury/ labral injury as potential cause of some of her discomfort vs radiculopathy from cervical spine vs thoracic outlet syndrome.  - She would be agreeable to ortho referral if needed after SM/OMT etc - Ambulatory referral to Sports Medicine - mobic Qd prescribed - F/U PRN   Reviewed expectations re: course of current medical issues.  Discussed self-management of symptoms.  Outlined signs and symptoms indicating need for more acute intervention.  Patient verbalized understanding and all questions were answered.  Patient received an After-Visit Summary.    No orders of the defined types were placed in this encounter.    Note is dictated utilizing voice recognition software. Although note has been proof read prior to signing, occasional typographical errors still can be missed. If any questions arise, please do not hesitate to call for verification.   electronically signed by:  Howard Pouch, DO  [place]

## 2017-11-02 ENCOUNTER — Encounter: Payer: Self-pay | Admitting: Family Medicine

## 2017-11-03 ENCOUNTER — Ambulatory Visit
Admission: RE | Admit: 2017-11-03 | Discharge: 2017-11-03 | Disposition: A | Discharge status: Home / Self Care | Payer: 59 | Source: Ambulatory Visit | Attending: Obstetrics and Gynecology | Admitting: Obstetrics and Gynecology

## 2017-11-03 DIAGNOSIS — Z1231 Encounter for screening mammogram for malignant neoplasm of breast: Secondary | ICD-10-CM | POA: Diagnosis not present

## 2017-11-04 ENCOUNTER — Encounter: Payer: Self-pay | Admitting: Internal Medicine

## 2017-11-04 ENCOUNTER — Telehealth: Payer: Self-pay | Admitting: Internal Medicine

## 2017-11-04 NOTE — Telephone Encounter (Signed)
Appt has been scheduled for the pt to see Dr. Julien Nordmann on 12/27 at 1130am. Pt has agreed to the appt date and time. Letter mailed to the pt.

## 2017-11-11 ENCOUNTER — Encounter: Payer: Self-pay | Admitting: Sports Medicine

## 2017-11-11 ENCOUNTER — Ambulatory Visit (INDEPENDENT_AMBULATORY_CARE_PROVIDER_SITE_OTHER): Payer: 59 | Admitting: Sports Medicine

## 2017-11-11 VITALS — BP 138/82 | HR 83 | Ht 61.0 in | Wt 173.6 lb

## 2017-11-11 DIAGNOSIS — M79601 Pain in right arm: Secondary | ICD-10-CM | POA: Diagnosis not present

## 2017-11-11 DIAGNOSIS — M4722 Other spondylosis with radiculopathy, cervical region: Secondary | ICD-10-CM

## 2017-11-11 NOTE — Assessment & Plan Note (Signed)
Atypical presentation of right arm pain with possibly double crush syndrome coming from a mild C6 radiculitis and possible dynamic TOS. Given the possibility for the crush therapeutic exercises focusing on core stabilization, thoracic outlet stretching and postural exercises recommended at this time.  She has only mild symptoms at this time but I suspect with given her occupation there is a dynamic component and likely worsens if any neck irritation such as when she recently traveled.  If any lack of improvement can consider further evaluation of cervical spine but she is not interested in further interventions further diagnostic testing will be deferred.  Can consider gabapentin in the future if persistent symptoms.

## 2017-11-11 NOTE — Procedures (Signed)
PROCEDURE NOTE: THERAPEUTIC EXERCISES (97110) 15 minutes spent for Therapeutic exercises as below and as referenced in the AVS. This included exercises focusing on stretching, strengthening, with significant focus on eccentric aspects.  Proper technique shown and discussed handout in great detail with ATC. All questions were discussed and answered.   Long term goals include an improvement in range of motion, strength, endurance as well as avoiding reinjury. Frequency of visits is one time as determined during today's  office visit. Frequency of exercises to be performed is as per handout.  EXERCISES REVIEWED:  Scapular stabilization  Scalene stretches  Pec stretching  Goodman exercises

## 2017-11-11 NOTE — Patient Instructions (Addendum)
Please perform the exercise program that we have prepared for you and gone over in detail on a daily basis.  In addition to the handout you were provided you can access your program through: [email or web address]   Your unique program code is: [email or web address]    Also check out "[employer]" which is a program developed by Dr. Minerva Ends.   There are links to a couple of his YouTube Videos below and I would like to see performing one of his videos 5-6 days per week.    A good intro video is: "Independence from Pain 7-minute Video" - [email or web address]   His more advanced video is: "Powerful Posture and Pain Relief: 12 minutes of Foundation Training" - https://youtu.[email or web address]  Do not try to attempt this entire video when first beginning.    Try breaking of each exercise that he goes into shorter segments.  Otherwise if they perform an exercise for 45 seconds, start with 15 seconds and rest and then resume when they begin the new activity.    If you work your way up to doing this 12 minute video, I expect you will see significant improvements in your pain.  If you enjoy his videos and would like to find out more you can look on his website: [email or web address]  He has a workout streaming option as well as a DVD set available for purchase.  Amazon has the best price for his DVDs.

## 2017-11-11 NOTE — Progress Notes (Signed)
Mackenzie Jones. Mackenzie Jones, [place] at [facility] [phone]  Mackenzie Jones - 46 y.o. female MRN [id number]  Date of birth: 1971/11/12  Visit Date: 11/11/2017  PCP: Mackenzie Hillock, DO   Referred by: Mackenzie Hillock, DO   Scribe for today's visit: Mackenzie Jones, ATC    SUBJECTIVE:  Mackenzie Jones is here for New Patient (Initial Visit) (R shoulder pain) .  Referred by: Dr. Howard Pouch Her R shoulder pain symptoms INITIALLY: Began approximately a year ago w/ no MOI. Described as moderate aching pain w/ a sense of fatigue w/ pain radiating into her R upper arm to the elbow. Worsened with reaching behind and across her body Improved with Meloxicam and w/ heat. Additional associated symptoms include: radiating pain into the R upper arm. No N/T noted in the R UE and no neck pain.    At this time symptoms are worse compared to onset w/ increased fatigue noted in the R UE musculature. She has been using Meloxicam since last week.  Has tried heat.  Had an [place] last year and TOS was ruled out.  Symptoms flared up about a month ago when she was doing a lot of travelling and was picking up a lot of luggage.  Getting some clicking in her R shoulder.   ROS Denies night time disturbances. Denies fevers, chills, or night sweats. Denies unexplained weight loss. Denies personal history of cancer. Denies changes in bowel or bladder habits. Denies recent unreported falls. Denies new or worsening dyspnea or wheezing. Denies headaches or dizziness.  Denies numbness, tingling or weakness  In the extremities.  Denies dizziness or presyncopal episodes Denies lower extremity edema     HISTORY & PERTINENT PRIOR DATA:  Prior History reviewed and updated per electronic medical record.  Significant history, findings, studies and interim changes include:  reports that  has never smoked. she has never used smokeless tobacco. Recent Labs    02/02/17 0939 10/19/17 0905    HGBA1C 5.5 5.3   No specialty comments available. Problem  Right Arm Pain   X-rays 10/2015: Small endplate osteophyte at C6. Normal vascular ultrasound on 10/12/2015 to evaluate for potential TOS: Normal     OBJECTIVE:  VS:  HT:5' 1"  (154.9 cm)   WT:173 lb 9.6 oz (78.7 kg)  BMI:32.82    BP:138/82  HR:83bpm  TEMP: ( )  RESP:98 %  PHYSICAL EXAM: Constitutional: WDWN, Non-toxic appearing. Psychiatric: Alert & appropriately interactive. Not depressed or anxious appearing. Respiratory: No increased work of breathing. Trachea Midline Eyes: Pupils are equal. EOM intact without nystagmus. No scleral icterus Cardiovascular:  Peripheral Pulses: peripheral pulses symmetrical No clubbing or cyanosis appreciated Capillary Refill is normal, less than 2 seconds No signficant generalized edema/anasarca Sensory Exam: intact to light touch  SHOULDER Findings: No stiffness, no weakness, no crepitus noted.  Tightness with pec stretching but no significant structural limitations.  Shoulder exam is otherwise normal.  She has a normal Roos test  Neck:   Well aligned, no significant torticollis  Midline Bony TTP: none   Paraspinal Muscle Spasm: No  CERVICAL ROM: normal range of motion  supple  NEURAL TENSION SIGNS Right Left  Brachial Plexus Squeeze: positive, mild pain normal, no pain  Arm Squeeze Test: positive, mild pain normal, no pain  Spurling's Compression Test: normal, no pain normal, no pain  Lhermitte's Compression test: Negative, no radiating pain   REFLEXES Right Left  DTR - C5 -Biceps  2+ 2+  DTR - C6 - Brachiorad 2+ 2+  DTR - C7 - Triceps 2+ 2+   UMN - Hoffman's Negative/Normal Negative/Normal  UMN - Pectoral     MOTOR TESTING: Intact in all UE myotomes and symmetric   Findings:  Grip strength testing: Right 70, 65, 65 Left: 75, 65, 70    ASSESSMENT & PLAN:   1. Right arm pain   2. Osteoarthritis of spine with radiculopathy, cervical region    PLAN:     Right arm pain Atypical presentation of right arm pain with possibly double crush syndrome coming from a mild C6 radiculitis and possible dynamic TOS. Given the possibility for the crush therapeutic exercises focusing on core stabilization, thoracic outlet stretching and postural exercises recommended at this time.  She has only mild symptoms at this time but I suspect with given her occupation there is a dynamic component and likely worsens if any neck irritation such as when she recently traveled.  If any lack of improvement can consider further evaluation of cervical spine but she is not interested in further interventions further diagnostic testing will be deferred.  Can consider gabapentin in the future if persistent symptoms.  >50% of this 30 minute visit spent in direct patient counseling and/or coordination of care.  Discussion was focused on education regarding the in discussing the pathoetiology and anticipated clinical course of the above condition.  ++++++++++++++++++++++++++++++++++++++++++++ Orders & Meds: Orders Placed This Encounter  Procedures  . Misc procedure    No orders of the defined types were placed in this encounter.   ++++++++++++++++++++++++++++++++++++++++++++ Follow-up: Return in about 8 weeks (around 01/06/2018).   Pertinent documentation may be included in additional procedure notes, imaging studies, problem based documentation and patient instructions. Please see these sections of the encounter for additional information regarding this visit. CMA/ATC served as Education administrator during this visit. History, Physical, and Plan performed by medical provider. Documentation and orders reviewed and attested to.      Mackenzie Jones, [place] [facility] Physician

## 2017-11-22 ENCOUNTER — Encounter: Payer: Self-pay | Admitting: Internal Medicine

## 2017-11-25 ENCOUNTER — Encounter: Payer: 59 | Admitting: Internal Medicine

## 2017-11-25 ENCOUNTER — Telehealth: Payer: Self-pay | Admitting: Medical Oncology

## 2017-11-25 NOTE — Telephone Encounter (Signed)
Pt sent e-mail to cancel appt. She said she will call back if she wants to reschedule.

## 2017-12-09 DIAGNOSIS — Z01411 Encounter for gynecological examination (general) (routine) with abnormal findings: Secondary | ICD-10-CM | POA: Diagnosis not present

## 2017-12-09 DIAGNOSIS — Z124 Encounter for screening for malignant neoplasm of cervix: Secondary | ICD-10-CM | POA: Diagnosis not present

## 2017-12-09 DIAGNOSIS — Z6839 Body mass index (BMI) 39.0-39.9, adult: Secondary | ICD-10-CM | POA: Diagnosis not present

## 2017-12-28 ENCOUNTER — Telehealth: Payer: Self-pay | Admitting: Oncology

## 2017-12-28 ENCOUNTER — Encounter: Payer: Self-pay | Admitting: Oncology

## 2017-12-28 NOTE — Telephone Encounter (Signed)
Pt's hematology appt has been rescheduled for her to see Dr. Alen Blew on 2/28 at 11am. Pt aware to arrive 30 minutes early. New letter mailed to the pt.

## 2018-01-06 ENCOUNTER — Ambulatory Visit: Payer: 59 | Admitting: Sports Medicine

## 2018-01-27 ENCOUNTER — Inpatient Hospital Stay: Payer: 59 | Attending: Internal Medicine | Admitting: Oncology

## 2018-01-27 ENCOUNTER — Telehealth: Payer: Self-pay

## 2018-01-27 VITALS — BP 143/79 | HR 96 | Temp 98.6°F | Resp 18 | Ht 61.0 in | Wt 175.5 lb

## 2018-01-27 DIAGNOSIS — Z832 Family history of diseases of the blood and blood-forming organs and certain disorders involving the immune mechanism: Secondary | ICD-10-CM

## 2018-01-27 DIAGNOSIS — Z7901 Long term (current) use of anticoagulants: Secondary | ICD-10-CM | POA: Diagnosis not present

## 2018-01-27 NOTE — Telephone Encounter (Signed)
No los per 2/28.

## 2018-01-27 NOTE — Progress Notes (Signed)
Reason for Referral: Family history of hypercoagulable state.  HPI: 47 year old woman currently of [place] where she lived over 20 years.  She is a rather healthy woman without any significant comorbid conditions.  She is concerned about her family history of a hypercoagulable state after her father suffered a ischemic stroke 4 years ago.  He was in his mid 25s at the time.  He was told that he has protein S deficiency and was recommended that she is evaluated for the same condition.  She does not have any personal history of thrombosis.  She denies any deep vein thrombosis, pulmonary embolism or superficial phlebitis.  She had 2 successful pregnancies with vaginal delivery and no complications.  She did travel to [place] last November with a flight of 15 hours without any complications after that.  She did take aspirin prior to that trip.  She has no other family history of thrombosis or pregnancy complications.  She does not report any headaches, blurry vision, syncope or seizures. Does not report any fevers, chills or sweats.  Does not report any cough, wheezing or hemoptysis.  Does not report any chest pain, palpitation, orthopnea or leg edema.  Does not report any nausea, vomiting or abdominal pain.  Does not report any constipation or diarrhea.  Does not report any skeletal complaints.    Does not report frequency, urgency or hematuria.  Does not report any skin rashes or lesions. Does not report any heat or cold intolerance.  Does not report any lymphadenopathy or petechiae.  Does not report any anxiety or depression.  Remaining review of systems is negative.    Past Medical History:  Diagnosis Date  . Allergy   . Anemia   . G6PD deficiency ([place])    Pt reported histroy from when she was in the [employer]. No records.   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 Vitamin D deficiency   :  Past Surgical History:  Procedure Laterality Date  . ABLATION    . lipo sucttion   2013  . WISDOM TOOTH EXTRACTION  1996  :   Current Outpatient Medications:  .  atorvastatin (LIPITOR) 20 MG tablet, Take 1 tablet (20 mg total) by mouth daily., Disp: 90 tablet, Rfl: 3 .  cetirizine (ZYRTEC ALLERGY) 10 MG tablet, Take 1 tablet (10 mg total) by mouth daily., Disp: 30 tablet, Rfl: 0 .  fluticasone (FLONASE) 50 MCG/ACT nasal spray, Place into both nostrils daily as needed for allergies or rhinitis., Disp: , Rfl:  .  valACYclovir (VALTREX) 500 MG tablet, , Disp: , Rfl:  .  Vitamin D, Ergocalciferol, (DRISDOL) 50000 units CAPS capsule, Take 1 capsule (50,000 Units total) by mouth every 7 (seven) days., Disp: 12 capsule, Rfl: 0:  Allergies  Allergen Reactions  . Flagyl [Metronidazole]   . Sulfa Antibiotics   :  Family History  Problem Relation Age of Onset  . Hypertension Mother   . Hyperlipidemia Mother   . Protein S deficiency Father   . Heart disease Maternal Grandfather   . Arthritis Paternal Grandmother   . Hyperlipidemia Sister   . Breast cancer Neg Hx   :  Social History   Socioeconomic History  . Marital status: Married    Spouse name: brian  . Number of children: 2  . Years of education: Masters  . Highest education level: Not on file  Social Needs  . Financial resource strain: Not on file  . Food insecurity - worry: Not  on file  . Food insecurity - inability: Not on file  . Transportation needs - medical: Not on file  . Transportation needs - non-medical: Not on file  Occupational History  . Occupation: Chief Financial Officer  Tobacco Use  . Smoking status: Never Smoker  . Smokeless tobacco: Never Used  Substance and Sexual Activity  . Alcohol use: No  . Drug use: No  . Sexual activity: Yes    Partners: Male    Birth control/protection: Surgical    Comment: vas  Other Topics Concern  . Not on file  Social History Narrative   Married to Ledbetter. They have 2 daughters Junie Panning and Lockett).   Master's in Engineer, production. Works full time.   Drinks caffeine, takes  a daily vitamin.    Exercises routinely.   Smoke detector in the home.   Firearms locked in the home.    Feels safe in her relationships.   :  Pertinent items are noted in HPI.  Exam: Blood pressure (!) 143/79, pulse 96, temperature 98.6 F (37 C), temperature source Oral, resp. rate 18, height 5' 1"  (1.549 m), weight 175 lb 8 oz (79.6 kg), SpO2 99 %.  ECOG 0 General appearance: alert and cooperative appeared without distress. Head: atraumatic without any abnormalities. Eyes: conjunctivae/corneas clear. PERRL.  Sclera anicteric. Throat: lips, mucosa, and tongue normal; without oral thrush or ulcers. Resp: clear to auscultation bilaterally without rhonchi, wheezes or dullness to percussion. Cardio: regular rate and rhythm, S1, S2 normal, no murmur, click, rub or gallop GI: soft, non-tender; bowel sounds normal; no masses,  no organomegaly Skin: Skin color, texture, turgor normal. No rashes or lesions Lymph nodes: Cervical, supraclavicular, and axillary nodes normal. Neurologic: Grossly normal without any motor, sensory or deep tendon reflexes. Musculoskeletal: No joint deformity or effusion.  CBC    Component Value Date/Time   WBC 8.5 10/19/2017 0905   RBC 4.44 10/19/2017 0905   HGB 13.1 10/19/2017 0905   HGB 11.9 02/02/2017 0939   HCT 39.8 10/19/2017 0905   HCT 36.1 02/02/2017 0939   PLT 335.0 10/19/2017 0905   MCV 89.7 10/19/2017 0905   MCV 87 02/02/2017 0939   MCH 28.7 02/02/2017 0939   MCH 29.2 04/22/2016 0928   MCHC 33.0 10/19/2017 0905   RDW 12.9 10/19/2017 0905   RDW 13.7 02/02/2017 0939   LYMPHSABS 3.8 10/19/2017 0905   LYMPHSABS 2.7 02/02/2017 0939   MONOABS 0.5 10/19/2017 0905   EOSABS 0.1 10/19/2017 0905   EOSABS 0.3 02/02/2017 0939   BASOSABS 0.1 10/19/2017 0905   BASOSABS 0.0 02/02/2017 0939     Chemistry      Component Value Date/Time   NA 139 10/19/2017 0905   NA 140 02/02/2017 0939   K 3.9 10/19/2017 0905   CL 106 10/19/2017 0905   CO2 27  10/19/2017 0905   BUN 16 10/19/2017 0905   BUN 15 02/02/2017 0939   CREATININE 0.87 10/19/2017 0905   CREATININE 0.75 04/22/2016 0928      Component Value Date/Time   CALCIUM 9.5 10/19/2017 0905   ALKPHOS 89 10/19/2017 0905   AST 14 10/19/2017 0905   ALT 12 10/19/2017 0905   BILITOT 0.6 10/19/2017 0905   BILITOT <0.2 02/02/2017 0939       Assessment and Plan:   46 year old woman with the following issues:  1.  Family history of protein S deficiency: She has no personal history of thrombosis and her father had an arterial thrombosis 4 years ago.  He is currently on  warfarin and no other family history of thrombosis or pregnancy complications.  The natural course of inherited and acquired thrombophilia was discussed with the patient today.  First of all, I have questioned the diagnosis of this condition with her father but clearly without being involved in his care it is difficult for me to make that decision.  The fact that he had a arterial thrombosis in his 7s makes it unusual of a diagnosis.  The possibility that she has inherited similar condition was discussed today.  She had no personal history of thrombosis despite long flight and 2 pregnancies.  It is certainly she could still have inherited thrombophilia but considered less likely.  Risks and benefits of obtaining screening laboratory testing was discussed today.  She understands that if we find any abnormalities it will not necessarily affect our management or recommendation at this time.  I have recommended that she will take low-dose aspirin on a daily basis for cardiovascular protection in any case.  She also understands that screening for inherited and acquired thrombophilia would be necessary if she develops thrombosis in the future.  After discussion today, she deferred this option and would not like to be tested at this time.  2.  Follow-up: I am happy to see her in the future as needed.  I answered all her questions to  her satisfaction.  30  minutes was spent with the patient face-to-face today.  More than 50% of time was dedicated to patient counseling, education and answering questions regarding her family history and personal risk factors.

## 2018-03-14 ENCOUNTER — Ambulatory Visit (INDEPENDENT_AMBULATORY_CARE_PROVIDER_SITE_OTHER): Payer: 59 | Admitting: Family Medicine

## 2018-03-14 ENCOUNTER — Encounter: Payer: Self-pay | Admitting: Family Medicine

## 2018-03-14 VITALS — BP 124/80 | HR 115 | Temp 98.0°F | Ht 61.0 in | Wt 179.2 lb

## 2018-03-14 DIAGNOSIS — M79672 Pain in left foot: Secondary | ICD-10-CM | POA: Diagnosis not present

## 2018-03-14 DIAGNOSIS — G5762 Lesion of plantar nerve, left lower limb: Secondary | ICD-10-CM

## 2018-03-14 NOTE — Progress Notes (Signed)
Mackenzie Jones , March 29, 1971, 47 y.o., female MRN: [id number] Patient Care Team    Relationship Specialty Notifications Start End  Ma Hillock, DO PCP - General Family Medicine  01/18/17    Comment: patient request transfer to [facility]  Ena Dawley, MD Consulting Physician Obstetrics and Gynecology  01/18/17   Nat Christen, MD Attending Physician Optometry  01/18/17     Chief Complaint  Patient presents with  . Warm sensation    Pt c/o warm sensation on the left foot X 2 weeks. she denies any pain and tingling sensastion     Subjective: Pt presents for an OV with complaints of left foot discomfort intermittently for 2 weeks  duration.  Associated symptoms include she describes the sensation as a quick flash of warmth. It last a second and occurs once daily. Started less frequently until 3 days ago. She denies trauma, or running. She is working out more often and has new shoes.  Discomfort does not occur at any particular time or activity.   Depression screen [facility] 2/9 10/19/2017 02/02/2017 01/18/2017  Decreased Interest 0 1 0  Down, Depressed, Hopeless 0 1 0  PHQ - 2 Score 0 2 0  Altered sleeping 0 0 -  Tired, decreased energy 0 2 -  Change in appetite 0 1 -  Feeling bad or failure about yourself  0 1 -  Trouble concentrating 0 1 -  Moving slowly or fidgety/restless 0 0 -  Suicidal thoughts 0 0 -  PHQ-9 Score 0 7 -    Allergies  Allergen Reactions  . Flagyl [Metronidazole]   . Sulfa Antibiotics    Social History   Tobacco Use  . Smoking status: Never Smoker  . Smokeless tobacco: Never Used  Substance Use Topics  . Alcohol use: No   Past Medical History:  Diagnosis Date  . Allergy   . Anemia   . G6PD deficiency ([place])    Pt reported histroy from when she was in the [employer]. No records.   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 Vitamin D deficiency    Past Surgical History:  Procedure Laterality Date  . ABLATION    . lipo sucttion  2013    . WISDOM TOOTH EXTRACTION  1996   Family History  Problem Relation Age of Onset  . Hypertension Mother   . Hyperlipidemia Mother   . Protein S deficiency Father   . Heart disease Maternal Grandfather   . Arthritis Paternal Grandmother   . Hyperlipidemia Sister   . Breast cancer Neg Hx    Allergies as of 03/14/2018      Reactions   Flagyl [metronidazole]    Sulfa Antibiotics       Medication List        Accurate as of 03/14/18  2:01 PM. Always use your most recent med list.          atorvastatin 20 MG tablet Commonly known as:  LIPITOR Take 1 tablet (20 mg total) by mouth daily.   cetirizine 10 MG tablet Commonly known as:  ZYRTEC ALLERGY Take 1 tablet (10 mg total) by mouth daily.   fluticasone 50 MCG/ACT nasal spray Commonly known as:  FLONASE Place into both nostrils daily as needed for allergies or rhinitis.   valACYclovir 500 MG tablet Commonly known as:  VALTREX       All past medical history, surgical history, allergies, family history, immunizations andmedications were updated in the EMR  today and reviewed under the history and medication portions of their EMR.     ROS: Negative, with the exception of above mentioned in HPI   Objective:  BP 124/80 (BP Location: Right Arm, Patient Position: Sitting, Cuff Size: Large)   Pulse (!) 115   Temp 98 F (36.7 C) (Oral)   Ht 5' 1"  (1.549 m)   Wt 179 lb 3.2 oz (81.3 kg)   SpO2 97%   BMI 33.86 kg/m  Body mass index is 33.86 kg/m. Gen: Afebrile. No acute distress. Nontoxic in appearance, well developed, well nourished.  Neuro/MSK:  Normal gait. No erythema or swelling. No TTP left foot. Good cap refill. Sensation intake. No current symptoms. No exam data present No results found. No results found for this or any previous visit (from the past 24 hour(s)).  Assessment/Plan: Cherissa Hook is a 47 y.o. female present for OV for  Left foot pain Morton neuroma, left HPI consistent with morton neuroma. Her exam  is normal today.  - AVS education, at home instructions and podiatry referral placed for her.  - Ambulatory referral to Podiatry F/U PRN   Reviewed expectations re: course of current medical issues.  Discussed self-management of symptoms.  Outlined signs and symptoms indicating need for more acute intervention.  Patient verbalized understanding and all questions were answered.  Patient received an After-Visit Summary.    Orders Placed This Encounter  Procedures  . Ambulatory referral to Podiatry     Note is dictated utilizing voice recognition software. Although note has been proof read prior to signing, occasional typographical errors still can be missed. If any questions arise, please do not hesitate to call for verification.   electronically signed by:  Howard Pouch, DO  [place]

## 2018-03-14 NOTE — Patient Instructions (Signed)
Morton Neuralgia Morton neuralgia is a type of foot pain in the area closest to your toes. This area is sometimes called the ball of your foot. Morton neuralgia occurs when a branch of a nerve in your foot (digital nerve) becomes compressed. When this happens over a long period of time, the nerve can thicken (neuroma) and cause pain. This usually occurs between the third and fourth toe. Morton neuralgia can come and go but may get worse over time. What are the causes? Your digital nerve can become compressed and stretched at a point where it passes under a thick band of tissue that connects your toes (intermetatarsal ligament). Morton neuralgia can be caused by mild repetitive damage in this area. This type of damage can result from:  Activities such as running or jumping.  Wearing shoes that are too tight.  What increases the risk? You may be at risk for Morton neuralgia if you:  Are female.  Wear high heels.  Wear shoes that are narrow or tight.  Participate in activities that stretch your toes. These include: ? Running. ? [place]. ? Long-distance walking.  What are the signs or symptoms? The first symptom of Morton neuralgia is pain that spreads from the ball of your foot to your toes. It may feel like you are walking on a marble. Pain usually gets worse with walking and goes away at night. Other symptoms may include numbness and cramping of your toes. How is this diagnosed? Your health care provider will do a physical exam. When doing the exam, your health care provider may:  Squeeze your foot just behind your toe.  Ask you to move your toes to check for pain.  You may also have tests on your foot to confirm the diagnosis. These may include:  An X-ray.  An MRI.  How is this treated? Treatment for Morton neuralgia may be as simple as changing the kind of shoes you wear. Other treatments may include:  Wearing a supportive pad (orthosis) under the front of your foot. This  lifts your toe bones and takes pressure off the nerve.  Getting injections of numbing medicine and anti-inflammatory medicine (steroid) in the nerve.  Having surgery to remove part of the thickened nerve.  Follow these instructions at home:  Take medicine only as directed by your health care provider.  Wear soft-soled shoes with a wide toe area.  Stop activities that may be causing pain.  Elevate your foot when resting.  Massage your foot.  Apply ice to the injured area: ? Put ice in a plastic bag. ? Place a towel between your skin and the bag. ? Leave the ice on for 20 minutes, 2-3 times a day.  Keep all follow-up visits as directed by your health care provider. This is important. Contact a health care provider if:  Home care instructions are not helping you get better.  Your symptoms change or get worse. This information is not intended to replace advice given to you by your health care provider. Make sure you discuss any questions you have with your health care provider. Document Released: 02/22/2001 Document Revised: 04/23/2016 Document Reviewed: 01/17/2014 Elsevier Interactive Patient Education  [employer].

## 2018-03-29 ENCOUNTER — Ambulatory Visit: Payer: 59 | Admitting: Podiatry

## 2018-04-18 MED FILL — PHENTERMINE 37.5 MG TABLET: 37.5 | 30 days supply | Qty: 30 | Fill #0

## 2018-05-12 ENCOUNTER — Encounter: Payer: Self-pay | Admitting: Family Medicine

## 2018-06-10 DIAGNOSIS — M9903 Segmental and somatic dysfunction of lumbar region: Secondary | ICD-10-CM | POA: Diagnosis not present

## 2018-06-10 DIAGNOSIS — M9905 Segmental and somatic dysfunction of pelvic region: Secondary | ICD-10-CM | POA: Diagnosis not present

## 2018-06-10 DIAGNOSIS — M9902 Segmental and somatic dysfunction of thoracic region: Secondary | ICD-10-CM | POA: Diagnosis not present

## 2018-06-15 DIAGNOSIS — M9902 Segmental and somatic dysfunction of thoracic region: Secondary | ICD-10-CM | POA: Diagnosis not present

## 2018-06-15 DIAGNOSIS — M9905 Segmental and somatic dysfunction of pelvic region: Secondary | ICD-10-CM | POA: Diagnosis not present

## 2018-06-15 DIAGNOSIS — M9903 Segmental and somatic dysfunction of lumbar region: Secondary | ICD-10-CM | POA: Diagnosis not present

## 2018-06-22 DIAGNOSIS — M9903 Segmental and somatic dysfunction of lumbar region: Secondary | ICD-10-CM | POA: Diagnosis not present

## 2018-06-22 DIAGNOSIS — M9902 Segmental and somatic dysfunction of thoracic region: Secondary | ICD-10-CM | POA: Diagnosis not present

## 2018-06-22 DIAGNOSIS — M9905 Segmental and somatic dysfunction of pelvic region: Secondary | ICD-10-CM | POA: Diagnosis not present

## 2018-06-24 DIAGNOSIS — M9902 Segmental and somatic dysfunction of thoracic region: Secondary | ICD-10-CM | POA: Diagnosis not present

## 2018-06-24 DIAGNOSIS — M9905 Segmental and somatic dysfunction of pelvic region: Secondary | ICD-10-CM | POA: Diagnosis not present

## 2018-06-24 DIAGNOSIS — M9903 Segmental and somatic dysfunction of lumbar region: Secondary | ICD-10-CM | POA: Diagnosis not present

## 2018-07-08 DIAGNOSIS — M9902 Segmental and somatic dysfunction of thoracic region: Secondary | ICD-10-CM | POA: Diagnosis not present

## 2018-07-08 DIAGNOSIS — M9903 Segmental and somatic dysfunction of lumbar region: Secondary | ICD-10-CM | POA: Diagnosis not present

## 2018-07-08 DIAGNOSIS — M9905 Segmental and somatic dysfunction of pelvic region: Secondary | ICD-10-CM | POA: Diagnosis not present

## 2018-07-22 DIAGNOSIS — M9902 Segmental and somatic dysfunction of thoracic region: Secondary | ICD-10-CM | POA: Diagnosis not present

## 2018-07-22 DIAGNOSIS — M9903 Segmental and somatic dysfunction of lumbar region: Secondary | ICD-10-CM | POA: Diagnosis not present

## 2018-07-22 DIAGNOSIS — M9905 Segmental and somatic dysfunction of pelvic region: Secondary | ICD-10-CM | POA: Diagnosis not present

## 2018-08-23 DIAGNOSIS — Z23 Encounter for immunization: Secondary | ICD-10-CM | POA: Diagnosis not present

## 2018-10-20 ENCOUNTER — Encounter: Payer: 59 | Admitting: Family Medicine

## 2018-10-31 ENCOUNTER — Encounter: Payer: Self-pay | Admitting: Family Medicine

## 2018-10-31 ENCOUNTER — Ambulatory Visit (INDEPENDENT_AMBULATORY_CARE_PROVIDER_SITE_OTHER): Payer: 59 | Admitting: Family Medicine

## 2018-10-31 VITALS — BP 144/76 | Temp 98.5°F | Resp 16 | Ht 60.0 in | Wt 169.0 lb

## 2018-10-31 DIAGNOSIS — E559 Vitamin D deficiency, unspecified: Secondary | ICD-10-CM

## 2018-10-31 DIAGNOSIS — E785 Hyperlipidemia, unspecified: Secondary | ICD-10-CM

## 2018-10-31 DIAGNOSIS — E669 Obesity, unspecified: Secondary | ICD-10-CM

## 2018-10-31 DIAGNOSIS — Z131 Encounter for screening for diabetes mellitus: Secondary | ICD-10-CM | POA: Diagnosis not present

## 2018-10-31 DIAGNOSIS — Z0000 Encounter for general adult medical examination without abnormal findings: Secondary | ICD-10-CM

## 2018-10-31 DIAGNOSIS — Z130 Encounter for screening for diseases of the blood and blood-forming organs and certain disorders involving the immune mechanism: Secondary | ICD-10-CM

## 2018-10-31 NOTE — Progress Notes (Signed)
Patient ID: Mackenzie Jones, female  DOB: 1971-11-22, 47 y.o.   MRN: [id number] Patient Care Team    Relationship Specialty Notifications Start End  Ma Hillock, DO PCP - General Family Medicine  01/18/17    Comment: patient request transfer to [facility]  Ena Dawley, MD Consulting Physician Obstetrics and Gynecology  01/18/17   Nat Christen, MD Attending Physician Optometry  01/18/17     Chief Complaint  Patient presents with  . Annual Exam    Subjective:  Mackenzie Jones is a 47 y.o.  Female  present for CPE. All past medical history, surgical history, allergies, family history, immunizations, medications and social history were updated in the electronic medical record today. All recent labs, ED visits and hospitalizations within the last year were reviewed.  Health maintenance:  Colonoscopy: No Fhx, screen at 49. Mammogram: completed: 10/2017, birads 1. Has GYN appt scheduled beginning of January and will have completed there.  Cervical cancer screening: last pap: 2016 Immunizations: tdap 2017 UTD, Influenza 08/2018 (encouraged yearly) Infectious disease screening: HIV declined Assistive device: none Oxygen use: none Patient has a Dental home. Hospitalizations/ED visits: reviewed  Depression screen [facility] 2/9 10/31/2018 10/19/2017 02/02/2017 01/18/2017  Decreased Interest 0 0 1 0  Down, Depressed, Hopeless 0 0 1 0  PHQ - 2 Score 0 0 2 0  Altered sleeping - 0 0 -  Tired, decreased energy - 0 2 -  Change in appetite - 0 1 -  Feeling bad or failure about yourself  - 0 1 -  Trouble concentrating - 0 1 -  Moving slowly or fidgety/restless - 0 0 -  Suicidal thoughts - 0 0 -  PHQ-9 Score - 0 7 -   No flowsheet data found.  Immunization History  Administered Date(s) Administered  . Tdap 04/22/2016    Past Medical History:  Diagnosis Date  . Allergy   . Anemia   . Depression   . G6PD deficiency    Pt reported histroy from when she was in the [employer]. No records.   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 Vitamin D deficiency    Allergies  Allergen Reactions  . Flagyl [Metronidazole]   . Sulfa Antibiotics    Past Surgical History:  Procedure Laterality Date  . ABLATION    . lipo sucttion  2013  . WISDOM TOOTH EXTRACTION  1996   Family History  Problem Relation Age of Onset  . Hypertension Mother   . Hyperlipidemia Mother   . Protein S deficiency Father   . Heart disease Maternal Grandfather   . Arthritis Paternal Grandmother   . Hyperlipidemia Sister   . Breast cancer Neg Hx    Social History   Socioeconomic History  . Marital status: Married    Spouse name: brian  . Number of children: 2  . Years of education: Masters  . Highest education level: Not on file  Occupational History  . Occupation: Chief Financial Officer  Social Needs  . Financial resource strain: Not on file  . Food insecurity:    Worry: Not on file    Inability: Not on file  . Transportation needs:    Medical: Not on file    Non-medical: Not on file  Tobacco Use  . Smoking status: Never Smoker  . Smokeless tobacco: Never Used  Substance and Sexual Activity  . Alcohol use: No  . Drug use: No  . Sexual activity: Yes    Partners:  Male    Birth control/protection: Surgical    Comment: vas  Lifestyle  . Physical activity:    Days per week: Not on file    Minutes per session: Not on file  . Stress: Not on file  Relationships  . Social connections:    Talks on phone: Not on file    Gets together: Not on file    Attends religious service: Not on file    Active member of club or organization: Not on file    Attends meetings of clubs or organizations: Not on file    Relationship status: Not on file  . Intimate partner violence:    Fear of current or ex partner: Not on file    Emotionally abused: Not on file    Physically abused: Not on file    Forced sexual activity: Not on file  Other Topics Concern  . Not on file  Social History Narrative    Married to Brian. They have 2 daughters (Erin and Erica).   Master's in Engineering. Works full time.   Drinks caffeine, takes a daily vitamin.    Exercises routinely.   Smoke detector in the home.   Firearms locked in the home.    Feels safe in her relationships.    Allergies as of 10/31/2018      Reactions   Flagyl [metronidazole]    Sulfa Antibiotics       Medication List        Accurate as of 10/31/18  5:18 PM. Always use your most recent med list.          fluticasone 50 MCG/ACT nasal spray Commonly known as:  FLONASE Place into both nostrils daily as needed for allergies or rhinitis.   valACYclovir 500 MG tablet Commonly known as:  VALTREX       All past medical history, surgical history, allergies, family history, immunizations andmedications were updated in the EMR today and reviewed under the history and medication portions of their EMR.     No results found for this or any previous visit (from the past 2160 hour(s)).  Mm Digital Screening Bilateral  Result Date: 11/03/2017 CLINICAL DATA:  Screening. EXAM: DIGITAL SCREENING BILATERAL MAMMOGRAM WITH CAD COMPARISON:  Previous exam(s). ACR Breast Density Category b: There are scattered areas of fibroglandular density. FINDINGS: There are no findings suspicious for malignancy. Images were processed with CAD. IMPRESSION: No mammographic evidence of malignancy. A result letter of this screening mammogram will be mailed directly to the patient. RECOMMENDATION: Screening mammogram in one year. (Code:SM-B-01Y) BI-RADS CATEGORY  1: Negative. Electronically Signed   By: Steven  Reid M.D.   On: 11/03/2017 08:58     ROS: 14 pt review of systems performed and negative (unless mentioned in an HPI)  Objective: BP (!) 144/76 (BP Location: Left Arm)   Temp 98.5 F (36.9 C) (Oral)   Resp 16   Ht 5' (1.524 m)   Wt 169 lb (76.7 kg)   SpO2 98%   BMI 33.01 kg/m  Gen: Afebrile. No acute distress. Nontoxic in appearance,  well-developed, well-nourished,  Very pleasant AAF.  HENT: AT. [place]. Bilateral TM visualized and normal in appearance, normal external auditory canal. MMM, no oral lesions, adequate dentition. Bilateral nares within normal limits. Throat without erythema, ulcerations or exudates. no Cough on exam, no hoarseness on exam. Eyes:Pupils Equal Round Reactive to light, Extraocular movements intact,  Conjunctiva without redness, discharge or icterus. Neck/lymp/endocrine: Supple,no lymphadenopathy, no thyromegaly CV: RRR no murmur, no edema, +  2/4 P posterior tibialis pulses. no carotid bruits. No JVD. Chest: CTAB, no wheeze, rhonchi or crackles. normal Respiratory effort. good Air movement. Abd: Soft. flat. NTND. BS present. no Masses palpated. No hepatosplenomegaly. No rebound tenderness or guarding. Skin: no rashes, purpura or petechiae. Warm and well-perfused. Skin intact. Neuro/Msk:  Normal gait. PERLA. EOMi. Alert. Oriented x3.  Cranial nerves II through XII intact. Muscle strength 5/5 upper/lower extremity. DTRs equal bilaterally. Psych: Normal affect, dress and demeanor. Normal speech. Normal thought content and judgment.  No exam data present  Assessment/plan: Mackenzie Jones is a 47 y.o. female present for CPE.  Dyslipidemia Not taking her statin- did not want to be in medication for cholesterol. Lengthy discussion today on benefits of statin with cholesterol levels as high as hers last year. Discussed stroke and MI prevention/lowering CV risk. She is agreeable to restart if levels warrant. - Comp Met (CMET) - TSH - Lipid panel Vitamin D deficiency Taking supplement daily - she is uncertain dose.  - Vitamin D (25 hydroxy) Obesity (BMI 30-39.9) Diet and exercise modifications.  Diabetes mellitus screening - HgB A1c Screening for deficiency anemia - CBC Encounter for preventive health examination Patient was encouraged to exercise greater than 150 minutes a week. Patient was encouraged to  choose a diet filled with fresh fruits and vegetables, and lean meats. AVS provided to patient today for education/recommendation on gender specific health and safety maintenance. Colonoscopy: No Fhx, screen at 50. Mammogram: completed: 10/2017, birads 1. Has GYN appt scheduled beginning of January and will have completed there.  Cervical cancer screening: last pap: 2016--> GYN scheduled.  Immunizations: tdap 2017 UTD, Influenza 08/2018 (encouraged yearly) Infectious disease screening: HIV declined  Return in about 1 year (around 11/01/2019) for CPE.  Electronically signed by: Renee Kuneff, DO [facility]  

## 2018-10-31 NOTE — Patient Instructions (Signed)

## 2018-11-01 ENCOUNTER — Encounter: Payer: Self-pay | Admitting: Family Medicine

## 2018-11-01 ENCOUNTER — Telehealth: Payer: Self-pay | Admitting: Family Medicine

## 2018-11-01 DIAGNOSIS — E782 Mixed hyperlipidemia: Secondary | ICD-10-CM

## 2018-11-01 LAB — LIPID PANEL
CHOL/HDL RATIO: 5.3 (calc) — AB (ref <5.0)
Cholesterol: 280 mg/dL — ABNORMAL HIGH (ref <200)
HDL: 53 mg/dL (ref >50)
LDL Cholesterol (Calc): 202 mg/dL (calc) — ABNORMAL HIGH
Non-HDL Cholesterol (Calc): 227 mg/dL (calc) — ABNORMAL HIGH (ref <130)
Triglycerides: 117 mg/dL (ref <150)

## 2018-11-01 LAB — COMPREHENSIVE METABOLIC PANEL
AG RATIO: 1.6 (calc) (ref 1.0–2.5)
ALT: 25 U/L (ref 6–29)
AST: 18 U/L (ref 10–35)
Albumin: 4.5 g/dL (ref 3.6–5.1)
Alkaline phosphatase (APISO): 105 U/L (ref 33–115)
BUN: 16 mg/dL (ref 7–25)
CALCIUM: 9.5 mg/dL (ref 8.6–10.2)
CO2: 23 mmol/L (ref 20–32)
Chloride: 103 mmol/L (ref 98–110)
Creat: 0.84 mg/dL (ref 0.50–1.10)
GLOBULIN: 2.9 g/dL (ref 1.9–3.7)
GLUCOSE: 78 mg/dL (ref 65–99)
Potassium: 3.6 mmol/L (ref 3.5–5.3)
SODIUM: 135 mmol/L (ref 135–146)
TOTAL PROTEIN: 7.4 g/dL (ref 6.1–8.1)
Total Bilirubin: 0.5 mg/dL (ref 0.2–1.2)

## 2018-11-01 LAB — HEMOGLOBIN A1C
EAG (MMOL/L): 5.8 (calc)
HEMOGLOBIN A1C: 5.3 %{Hb} (ref <5.7)
MEAN PLASMA GLUCOSE: 105 (calc)

## 2018-11-01 LAB — CBC
HEMATOCRIT: 38.2 % (ref 35.0–45.0)
Hemoglobin: 13.2 g/dL (ref 11.7–15.5)
MCH: 29.4 pg (ref 27.0–33.0)
MCHC: 34.6 g/dL (ref 32.0–36.0)
MCV: 85.1 fL (ref 80.0–100.0)
MPV: 9.9 fL (ref 7.5–12.5)
Platelets: 342 10*3/uL (ref 140–400)
RBC: 4.49 10*6/uL (ref 3.80–5.10)
RDW: 12.4 % (ref 11.0–15.0)
WBC: 9.3 10*3/uL (ref 3.8–10.8)

## 2018-11-01 LAB — VITAMIN D 25 HYDROXY (VIT D DEFICIENCY, FRACTURES): VIT D 25 HYDROXY: 51 ng/mL (ref 30–100)

## 2018-11-01 LAB — TSH: TSH: 2.23 mIU/L

## 2018-11-01 MED ORDER — ATORVASTATIN CALCIUM 20 MG PO TABS: 20.0000 mg | ORAL_TABLET | Freq: Every day | ORAL | 3 refills | Status: DC

## 2018-11-01 MED ORDER — ATORVASTATIN CALCIUM 40 MG PO TABS: 40.0000 mg | ORAL_TABLET | Freq: Every day | ORAL | 3 refills | Status: DC

## 2018-11-01 NOTE — Telephone Encounter (Signed)
Patient notified and verbalized understanding. Patient is in agreement to starting medication and will call back to schedule appointment for 3 month follow up.

## 2018-11-01 NOTE — Telephone Encounter (Signed)
Please inform patient the following information: All of her labs are normal, with the exception of her cholesterol- that unfortunately is highest it has ever been.  Her cholesterol panel result showed total cholesterol 280 (goal < 200), good cholesterol (HDL) 53 (this good), bad cholesterol (LDL) 202 (goal <130). American heart association recommends patients with a LDL (bad) cholesterol level at this level, with her fhx, age, female, African american--> be  placed on a statin for CV protection.  Diet low in saturated fats and higher fiber, along with routine exercise amounting to at least 150 minutes a week will certainly help-but not provide her cardioprotection as the statin group can.    I have called in the Lipitor to her pharamcy--  please schedule the patient for 3 mos for recheck with provider with fasting labs.   Also, please again apologize to her from me for running so late yesterday. I do appreciate her patience while we were having an office emergency with another patient.

## 2018-11-02 ENCOUNTER — Telehealth: Payer: Self-pay | Admitting: Family Medicine

## 2018-11-02 NOTE — Telephone Encounter (Signed)
Please advise.  ----- Message -----  From: Mackenzie Jones  Sent: 11/01/2018  1:31 PM EST  To: [facility]  Subject: Visit Follow-Up Question               Thanks for a great visit yesterday. I will start the cholesterol medication. Question: in reading my report it mentioned 'familial hypercholesterolemia' - is this something I should be tested for/pursue? I know that I have had elevated cholesterol since college.    Thanks, Mackenzie Jones       __________________________________________ Mackenzie Jones message above from patient.  Please inform her "familial hypercholesterolemia" is medical Jargon for likely genetic component as partial cause to continuous elevation cholesterol--> meaning higher risk of elevated cholesterol that does not respond to dietary/exercise modification well and runs in her family. Her family history Mother, sister and MGF are listed as having either heart disease or elevated cholesterol. The statin group of med helps lower her risk of heart disease, MI or Stroke-- which she recently restarted. Lowering her modifiable risk factors: healthy low saturated fat diet, routine exercise, higher fiber diet, and statin use is the treatment plan for this condition.   Hope that helps clear up her questions.

## 2018-11-02 NOTE — Telephone Encounter (Signed)
Patient notified and verbalized understanding. 

## 2018-11-08 ENCOUNTER — Encounter: Payer: Self-pay | Admitting: Family Medicine

## 2018-12-15 DIAGNOSIS — Z01411 Encounter for gynecological examination (general) (routine) with abnormal findings: Secondary | ICD-10-CM | POA: Diagnosis not present

## 2018-12-15 DIAGNOSIS — Z124 Encounter for screening for malignant neoplasm of cervix: Secondary | ICD-10-CM | POA: Diagnosis not present

## 2018-12-15 DIAGNOSIS — Z6839 Body mass index (BMI) 39.0-39.9, adult: Secondary | ICD-10-CM | POA: Diagnosis not present

## 2019-01-02 DIAGNOSIS — J111 Influenza due to unidentified influenza virus with other respiratory manifestations: Secondary | ICD-10-CM | POA: Diagnosis not present

## 2019-01-02 DIAGNOSIS — R509 Fever, unspecified: Secondary | ICD-10-CM | POA: Diagnosis not present

## 2019-01-19 DIAGNOSIS — R0982 Postnasal drip: Secondary | ICD-10-CM | POA: Diagnosis not present

## 2019-01-19 DIAGNOSIS — R05 Cough: Secondary | ICD-10-CM | POA: Diagnosis not present

## 2019-01-19 DIAGNOSIS — D219 Benign neoplasm of connective and other soft tissue, unspecified: Secondary | ICD-10-CM | POA: Insufficient documentation

## 2019-11-06 ENCOUNTER — Encounter: Payer: 59 | Admitting: Family Medicine

## 2019-11-17 ENCOUNTER — Encounter: Payer: Self-pay | Admitting: Family Medicine

## 2019-11-17 ENCOUNTER — Ambulatory Visit (INDEPENDENT_AMBULATORY_CARE_PROVIDER_SITE_OTHER): Payer: 59 | Admitting: Family Medicine

## 2019-11-17 ENCOUNTER — Other Ambulatory Visit: Payer: Self-pay

## 2019-11-17 VITALS — BP 134/96 | HR 98 | Temp 97.3°F | Resp 17 | Ht 61.0 in | Wt 173.4 lb

## 2019-11-17 DIAGNOSIS — Z1211 Encounter for screening for malignant neoplasm of colon: Secondary | ICD-10-CM

## 2019-11-17 DIAGNOSIS — Z131 Encounter for screening for diabetes mellitus: Secondary | ICD-10-CM | POA: Diagnosis not present

## 2019-11-17 DIAGNOSIS — E782 Mixed hyperlipidemia: Secondary | ICD-10-CM

## 2019-11-17 DIAGNOSIS — Z0000 Encounter for general adult medical examination without abnormal findings: Secondary | ICD-10-CM | POA: Diagnosis not present

## 2019-11-17 DIAGNOSIS — Z23 Encounter for immunization: Secondary | ICD-10-CM

## 2019-11-17 DIAGNOSIS — I10 Essential (primary) hypertension: Secondary | ICD-10-CM

## 2019-11-17 DIAGNOSIS — E669 Obesity, unspecified: Secondary | ICD-10-CM

## 2019-11-17 DIAGNOSIS — Z832 Family history of diseases of the blood and blood-forming organs and certain disorders involving the immune mechanism: Secondary | ICD-10-CM

## 2019-11-17 DIAGNOSIS — E559 Vitamin D deficiency, unspecified: Secondary | ICD-10-CM | POA: Diagnosis not present

## 2019-11-17 LAB — HEMOGLOBIN A1C: Hgb A1c MFr Bld: 5.2 % (ref 4.6–6.5)

## 2019-11-17 LAB — COMPREHENSIVE METABOLIC PANEL
ALT: 17 U/L (ref 0–35)
AST: 17 U/L (ref 0–37)
Albumin: 4.3 g/dL (ref 3.5–5.2)
Alkaline Phosphatase: 119 U/L — ABNORMAL HIGH (ref 39–117)
BUN: 15 mg/dL (ref 6–23)
CO2: 27 mEq/L (ref 19–32)
Calcium: 9.3 mg/dL (ref 8.4–10.5)
Chloride: 104 mEq/L (ref 96–112)
Creatinine, Ser: 0.83 mg/dL (ref 0.40–1.20)
GFR: 88.29 mL/min (ref >60.00)
Glucose, Bld: 97 mg/dL (ref 70–99)
Potassium: 3.9 mEq/L (ref 3.5–5.1)
Sodium: 138 mEq/L (ref 135–145)
Total Bilirubin: 0.4 mg/dL (ref 0.2–1.2)
Total Protein: 6.8 g/dL (ref 6.0–8.3)

## 2019-11-17 LAB — TSH: TSH: 2.77 u[IU]/mL (ref 0.35–4.50)

## 2019-11-17 LAB — LIPID PANEL
Cholesterol: 177 mg/dL (ref 0–200)
HDL: 47.9 mg/dL (ref >39.00)
LDL Cholesterol: 111 mg/dL — ABNORMAL HIGH (ref 0–99)
NonHDL: 128.82
Total CHOL/HDL Ratio: 4
Triglycerides: 87 mg/dL (ref 0.0–149.0)
VLDL: 17.4 mg/dL (ref 0.0–40.0)

## 2019-11-17 LAB — CBC
HCT: 37.5 % (ref 36.0–46.0)
Hemoglobin: 12.5 g/dL (ref 12.0–15.0)
MCHC: 33.4 g/dL (ref 30.0–36.0)
MCV: 87.8 fl (ref 78.0–100.0)
Platelets: 357 10*3/uL (ref 150.0–400.0)
RBC: 4.27 Mil/uL (ref 3.87–5.11)
RDW: 13.6 % (ref 11.5–15.5)
WBC: 8.5 10*3/uL (ref 4.0–10.5)

## 2019-11-17 LAB — VITAMIN D 25 HYDROXY (VIT D DEFICIENCY, FRACTURES): VITD: 42.82 ng/mL (ref 30.00–100.00)

## 2019-11-17 MED ORDER — AMLODIPINE BESYLATE 2.5 MG PO TABS: 2.5000 mg | ORAL_TABLET | Freq: Every day | ORAL | 1 refills | Status: DC

## 2019-11-17 MED ORDER — ATORVASTATIN CALCIUM 40 MG PO TABS: 40.0000 mg | ORAL_TABLET | Freq: Every day | ORAL | 3 refills | Status: DC

## 2019-11-17 NOTE — Patient Instructions (Signed)
Health Maintenance, Female Adopting a healthy lifestyle and getting preventive care are important in promoting health and wellness. Ask your health care provider about:  The right schedule for you to have regular tests and exams.  Things you can do on your own to prevent diseases and keep yourself healthy. What should I know about diet, weight, and exercise? Eat a healthy diet   Eat a diet that includes plenty of vegetables, fruits, low-fat dairy products, and lean protein.  Do not eat a lot of foods that are high in solid fats, added sugars, or sodium. Maintain a healthy weight Body mass index (BMI) is used to identify weight problems. It estimates body fat based on height and weight. Your health care provider can help determine your BMI and help you achieve or maintain a healthy weight. Get regular exercise Get regular exercise. This is one of the most important things you can do for your health. Most adults should:  Exercise for at least 150 minutes each week. The exercise should increase your heart rate and make you sweat (moderate-intensity exercise).  Do strengthening exercises at least twice a week. This is in addition to the moderate-intensity exercise.  Spend less time sitting. Even light physical activity can be beneficial. Watch cholesterol and blood lipids Have your blood tested for lipids and cholesterol at 49 years of age, then have this test every 5 years. Have your cholesterol levels checked more often if:  Your lipid or cholesterol levels are high.  You are older than 49 years of age.  You are at high risk for heart disease. What should I know about cancer screening? Depending on your health history and family history, you may need to have cancer screening at various ages. This may include screening for:  Breast cancer.  Cervical cancer.  Colorectal cancer.  Skin cancer.  Lung cancer. What should I know about heart disease, diabetes, and high blood  pressure? Blood pressure and heart disease  High blood pressure causes heart disease and increases the risk of stroke. This is more likely to develop in people who have high blood pressure readings, are of African descent, or are overweight.  Have your blood pressure checked: ? Every 3-5 years if you are 18-39 years of age. ? Every year if you are 40 years old or older. Diabetes Have regular diabetes screenings. This checks your fasting blood sugar level. Have the screening done:  Once every three years after age 40 if you are at a normal weight and have a low risk for diabetes.  More often and at a younger age if you are overweight or have a high risk for diabetes. What should I know about preventing infection? Hepatitis B If you have a higher risk for hepatitis B, you should be screened for this virus. Talk with your health care provider to find out if you are at risk for hepatitis B infection. Hepatitis C Testing is recommended for:  Everyone born from 1945 through 1965.  Anyone with known risk factors for hepatitis C. Sexually transmitted infections (STIs)  Get screened for STIs, including gonorrhea and chlamydia, if: ? You are sexually active and are younger than 49 years of age. ? You are older than 49 years of age and your health care provider tells you that you are at risk for this type of infection. ? Your sexual activity has changed since you were last screened, and you are at increased risk for chlamydia or gonorrhea. Ask your health care provider if   you are at risk.  Ask your health care provider about whether you are at high risk for HIV. Your health care provider may recommend a prescription medicine to help prevent HIV infection. If you choose to take medicine to prevent HIV, you should first get tested for HIV. You should then be tested every 3 months for as long as you are taking the medicine. Pregnancy  If you are about to stop having your period (premenopausal) and  you may become pregnant, seek counseling before you get pregnant.  Take 400 to 800 micrograms (mcg) of folic acid every day if you become pregnant.  Ask for birth control (contraception) if you want to prevent pregnancy. Osteoporosis and menopause Osteoporosis is a disease in which the bones lose minerals and strength with aging. This can result in bone fractures. If you are 65 years old or older, or if you are at risk for osteoporosis and fractures, ask your health care provider if you should:  Be screened for bone loss.  Take a calcium or vitamin D supplement to lower your risk of fractures.  Be given hormone replacement therapy (HRT) to treat symptoms of menopause. Follow these instructions at home: Lifestyle  Do not use any products that contain nicotine or tobacco, such as cigarettes, e-cigarettes, and chewing tobacco. If you need help quitting, ask your health care provider.  Do not use street drugs.  Do not share needles.  Ask your health care provider for help if you need support or information about quitting drugs. Alcohol use  Do not drink alcohol if: ? Your health care provider tells you not to drink. ? You are pregnant, may be pregnant, or are planning to become pregnant.  If you drink alcohol: ? Limit how much you use to 0-1 drink a day. ? Limit intake if you are breastfeeding.  Be aware of how much alcohol is in your drink. In the [place] one drink equals one 12 oz bottle of beer (355 mL), one 5 oz glass of wine (148 mL), or one 1 oz glass of hard liquor (44 mL). General instructions  Schedule regular health, dental, and eye exams.  Stay current with your vaccines.  Tell your health care provider if: ? You often feel depressed. ? You have ever been abused or do not feel safe at home. Summary  Adopting a healthy lifestyle and getting preventive care are important in promoting health and wellness.  Follow your health care provider's instructions about healthy  diet, exercising, and getting tested or screened for diseases.  Follow your health care provider's instructions on monitoring your cholesterol and blood pressure. This information is not intended to replace advice given to you by your health care provider. Make sure you discuss any questions you have with your health care provider. Document Released: 06/01/2011 Document Revised: 11/09/2018 Document Reviewed: 11/09/2018 Elsevier Patient Education  2020 [employer]  

## 2019-11-17 NOTE — Progress Notes (Signed)
This visit occurred during the SARS-CoV-2 public health emergency.  Safety protocols were in place, including screening questions prior to the visit, additional usage of staff PPE, and extensive cleaning of exam room while observing appropriate contact time as indicated for disinfecting solutions.    Patient ID: Mackenzie Jones, female  DOB: 1971/09/08, 48 y.o.   MRN: [id number] Patient Care Team    Relationship Specialty Notifications Start End  Ma Hillock, DO PCP - General Family Medicine  01/18/17    Comment: patient request transfer to [facility]  Ena Dawley, MD Consulting Physician Obstetrics and Gynecology  01/18/17   Nat Christen, MD Attending Physician Optometry  01/18/17     Chief Complaint  Patient presents with  . Annual Exam    Subjective:  Mackenzie Jones is a 48 y.o.  Female  present for CPE. All past medical history, surgical history, allergies, family history, immunizations, medications and social history were updated in the electronic medical record today. All recent labs, ED visits and hospitalizations within the last year were reviewed.  Health maintenance:  Colonoscopy: No Fhx- no prior screen>> referral discussed and placed today Mammogram: completed: 11/2018 pre pt at gyn  Cervical cancer screening: last pap: 11/2018 per pt (GYN) Immunizations: tdap 2017 UTD, Influenza completed today(encouraged yearly). Can have shingrix by nurse appt after bday- if desired Infectious disease screening: HIV declined Assistive device: none Oxygen EYC:XKGY Patient has a Dental home. Hospitalizations/ED visits: available reviewed  Depression screen [facility] 2/9 11/17/2019 10/31/2018 10/19/2017 02/02/2017 01/18/2017  Decreased Interest 0 0 0 1 0  Down, Depressed, Hopeless 0 0 0 1 0  PHQ - 2 Score 0 0 0 2 0  Altered sleeping - - 0 0 -  Tired, decreased energy - - 0 2 -  Change in appetite - - 0 1 -  Feeling bad or failure about yourself  - - 0 1 -  Trouble concentrating - - 0 1 -    Moving slowly or fidgety/restless - - 0 0 -  Suicidal thoughts - - 0 0 -  PHQ-9 Score - - 0 7 -   No flowsheet data found.    Immunization History  Administered Date(s) Administered  . Influenza Inj Mdck Quad Pf 08/23/2018  . Influenza,inj,Quad PF,6+ Mos 11/17/2019  . Influenza,inj,quad, With Preservative 12/11/2014  . Influenza-Unspecified 08/31/2017, 09/11/2018  . Tdap 04/22/2016    Past Medical History:  Diagnosis Date  . Allergy   . Anemia   . Depression   . G6PD deficiency    Pt reported histroy from when she was in the [employer]. No records.   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 Vitamin D deficiency    Allergies  Allergen Reactions  . Flagyl [Metronidazole]   . Sulfa Antibiotics    Past Surgical History:  Procedure Laterality Date  . ABLATION    . lipo sucttion  2013  . WISDOM TOOTH EXTRACTION  1996   Family History  Problem Relation Age of Onset  . Hypertension Mother   . Hyperlipidemia Mother   . Protein S deficiency Father   . Heart disease Maternal Grandfather   . Arthritis Paternal Grandmother   . Hyperlipidemia Sister   . Breast cancer Neg Hx    Social History   Social History Narrative   Married to [place]. They have 2 daughters Junie Panning and [place]).   Master's in Engineer, production. Works full time.   Drinks caffeine, takes a daily vitamin.  Exercises routinely.   Smoke detector in the home.   Firearms locked in the home.    Feels safe in her relationships.     Allergies as of 11/17/2019      Reactions   Flagyl [metronidazole]    Sulfa Antibiotics       Medication List       Accurate as of November 17, 2019  3:14 PM. If you have any questions, ask your nurse or doctor.        amLODipine 2.5 MG tablet Commonly known as: NORVASC Take 1 tablet (2.5 mg total) by mouth daily. Started by: Howard Pouch, DO   atorvastatin 40 MG tablet Commonly known as: LIPITOR Take 1 tablet (40 mg total) by mouth daily.    fluticasone 50 MCG/ACT nasal spray Commonly known as: FLONASE Place into both nostrils daily as needed for allergies or rhinitis.   valACYclovir 500 MG tablet Commonly known as: VALTREX       All past medical history, surgical history, allergies, family history, immunizations andmedications were updated in the EMR today and reviewed under the history and medication portions of their EMR.     No results found for this or any previous visit (from the past 2160 hour(s)).   ROS: 14 pt review of systems performed and negative (unless mentioned in an HPI)  Objective: BP (!) 134/96 (BP Location: Left Arm, Patient Position: Sitting, Cuff Size: Normal)   Pulse 98   Temp (!) 97.3 F (36.3 C) (Temporal)   Resp 17   Ht 5' 1"  (1.549 m)   Wt 173 lb 6 oz (78.6 kg)   SpO2 99%   BMI 32.76 kg/m  Gen: Afebrile. No acute distress. Nontoxic in appearance, well-developed, well-nourished, very pleasant African-American female, mildly obese. HENT: AT. [place]. Bilateral TM visualized and normal in appearance, normal external auditory canal. MMM, no oral lesions, adequate dentition. Bilateral nares within normal limits. Throat without erythema, ulcerations or exudates.  No cough on exam, no hoarseness on exam. Eyes:Pupils Equal Round Reactive to light, Extraocular movements intact,  Conjunctiva without redness, discharge or icterus. Neck/lymp/endocrine: Supple, no lymphadenopathy, no thyromegaly CV: RRR no murmur, no edema, +2/4 P posterior tibialis pulses.  Chest: CTAB, no wheeze, rhonchi or crackles.  Normal respiratory effort.  Good air movement. Abd: Soft.  Flat. NTND. BS present.  No masses palpated. No hepatosplenomegaly. No rebound tenderness or guarding. Skin: No rashes, purpura or petechiae. Warm and well-perfused. Skin intact. Neuro/Msk:  Normal gait. PERLA. EOMi. Alert. Oriented x3.  Cranial nerves II through XII intact. Muscle strength 5/5 upper/lower extremity. DTRs equal bilaterally. Psych:  Normal affect, dress and demeanor. Normal speech. Normal thought content and judgment.   No exam data present  Assessment/plan: Mackenzie Jones is a 48 y.o. female present for CPE Need for influenza vaccination - Flu Vaccine QUAD 6+ mos PF IM (Fluarix Quad PF) Essential hypertension/ Mixed hyperlipidemia/Obesity (BMI 30-39.9) Patient has had borderline blood pressures for a few years.  Recent blood pressure at another office setting was quite elevated.  On multiple blood pressure rechecks today are low his BP is still with a higher diastolic than desired.  Discussed options with her today and she is agreeable to start amlodipine 2.5 mg daily. Low-sodium diet.  Routine exercise. Continue Lipitor 40 mg daily.  Tolerating nicely. - CBC - Comp Met (CMET) - Lipid panel collected today she is fasting - TSH - atorvastatin (LIPITOR) 40 MG tablet; Take 1 tablet (40 mg total) by mouth daily.  Dispense:  90 tablet; Refill: 3 Follow-up 3 months for recheck, once stable will follow every 6 months on chronic condition. Diabetes mellitus screening - HgB A1c Colon cancer screening - Ambulatory referral to Gastroenterology Vitamin D deficiency Taking supplement daily - she is uncertain totally weekly dose. About 10k weekly.  - Vitamin D (25 hydroxy) Routine general medical examination at a health care facility Patient was encouraged to exercise greater than 150 minutes a week. Patient was encouraged to choose a diet filled with fresh fruits and vegetables, and lean meats. AVS provided to patient today for education/recommendation on gender specific health and safety maintenance. Colonoscopy: No Fhx- no prior screen>> referral discussed and placed today Mammogram: completed: 11/2018 pre pt at gyn  Cervical cancer screening: last pap: 11/2018 per pt (GYN) Immunizations: tdap 2017 UTD, Influenza completed today(encouraged yearly). Can have shingrix by nurse appt after bday- if desired Infectious disease  screening: HIV declined  Follow-up on new onset hypertension in 3 months Return in about 1 year (around 11/16/2020) for CPE (30 min).  Orders Placed This Encounter  Procedures  . Flu Vaccine QUAD 6+ mos PF IM (Fluarix Quad PF)  . CBC  . Comp Met (CMET)  . HgB A1c  . Lipid panel  . TSH  . Vitamin D (25 hydroxy)  . Ambulatory referral to Gastroenterology     Electronically signed by: Howard Pouch, DO [place]

## 2019-11-21 ENCOUNTER — Telehealth: Payer: Self-pay

## 2019-11-21 DIAGNOSIS — Z1211 Encounter for screening for malignant neoplasm of colon: Secondary | ICD-10-CM

## 2019-11-21 NOTE — Telephone Encounter (Signed)
Okay to cancel GI referral in place Cologuard order for her colon cancer screening.  We did discuss this during her CPE.

## 2019-11-21 NOTE — Telephone Encounter (Signed)
Please contact patient about Colorguard test. Patient said it was offered to her during [place]. Patient is going to cancel her colonoscopy.

## 2019-11-21 NOTE — Telephone Encounter (Signed)
Pt had CPE on 11/17/2019. Referral was placed to GI MD. Please advise.

## 2019-11-21 NOTE — Telephone Encounter (Signed)
Pt was called and given information and order was placed for cologaurd.   Diane please cancel referral to GI.

## 2019-12-22 ENCOUNTER — Encounter: Payer: 59 | Admitting: Gastroenterology

## 2019-12-25 ENCOUNTER — Other Ambulatory Visit: Payer: Self-pay

## 2019-12-25 DIAGNOSIS — Z1211 Encounter for screening for malignant neoplasm of colon: Secondary | ICD-10-CM

## 2019-12-25 LAB — COLOGUARD: Cologuard: NEGATIVE

## 2019-12-25 NOTE — Addendum Note (Signed)
Addended by: Gerilyn Nestle on: 12/25/2019 02:22 PM   Modules accepted: Orders

## 2020-02-26 ENCOUNTER — Encounter: Payer: Self-pay | Admitting: Family Medicine

## 2020-02-26 ENCOUNTER — Telehealth: Payer: Self-pay

## 2020-02-26 ENCOUNTER — Other Ambulatory Visit: Payer: Self-pay

## 2020-02-26 ENCOUNTER — Ambulatory Visit (INDEPENDENT_AMBULATORY_CARE_PROVIDER_SITE_OTHER): Payer: 59 | Admitting: Family Medicine

## 2020-02-26 VITALS — BP 145/78 | HR 95 | Temp 98.2°F | Resp 17 | Ht 61.0 in | Wt 175.2 lb

## 2020-02-26 DIAGNOSIS — E669 Obesity, unspecified: Secondary | ICD-10-CM

## 2020-02-26 DIAGNOSIS — I10 Essential (primary) hypertension: Secondary | ICD-10-CM | POA: Diagnosis not present

## 2020-02-26 DIAGNOSIS — E782 Mixed hyperlipidemia: Secondary | ICD-10-CM | POA: Diagnosis not present

## 2020-02-26 MED ORDER — DOXYCYCLINE HYCLATE 100 MG PO TABS: 100.0000 mg | ORAL_TABLET | Freq: Two times a day (BID) | ORAL | 0 refills | Status: DC

## 2020-02-26 MED ORDER — AMLODIPINE BESYLATE 5 MG PO TABS: 5.0000 mg | ORAL_TABLET | Freq: Every day | ORAL | 1 refills | Status: DC

## 2020-02-26 NOTE — Progress Notes (Signed)
This visit occurred during the SARS-CoV-2 public health emergency.  Safety protocols were in place, including screening questions prior to the visit, additional usage of staff PPE, and extensive cleaning of exam room while observing appropriate contact time as indicated for disinfecting solutions.    Patient ID: Mackenzie Jones, female  DOB: February 27, 1971, 49 y.o.   MRN: [id number] Patient Care Team    Relationship Specialty Notifications Start End  Ma Hillock, DO PCP - General Family Medicine  01/18/17    Comment: patient request transfer to [facility]  Ena Dawley, MD Consulting Physician Obstetrics and Gynecology  01/18/17   Nat Christen, MD Attending Physician Optometry  01/18/17     Chief Complaint  Patient presents with  . Migraine    Pt has been watching diet/sodium intake for HTN. Pt has been waking up with headaches in the morning and is unsure if it is from allergies/sinus or HTN. Pt has not taken medication this AM and "pretty sure" she did take it yesterday.   . Hypertension    Subjective: Mackenzie Jones is a 49 y.o.  Female  present for  Hypertension:  Pt reports compliance with amlodipine 2.5 mg. Blood pressures ranges at home have remained over goal. Patient denies chest pain, shortness of breath or lower extremity edema.Pt is taking statin. She is complaining of headaches today that occur only in the morning- frontal headaches. Clear once she gets in the shower. She endorses nasal drainage and congestion about 1 month ago. She started zyrtec and flonase at that time. She is unsure if headache is from her HTN  Or sinus. She denies snoring except when she is extremely tired.   Depression screen [facility] 2/9 11/17/2019 10/31/2018 10/19/2017 02/02/2017 01/18/2017  Decreased Interest 0 0 0 1 0  Down, Depressed, Hopeless 0 0 0 1 0  PHQ - 2 Score 0 0 0 2 0  Altered sleeping - - 0 0 -  Tired, decreased energy - - 0 2 -  Change in appetite - - 0 1 -  Feeling bad or failure about yourself   - - 0 1 -  Trouble concentrating - - 0 1 -  Moving slowly or fidgety/restless - - 0 0 -  Suicidal thoughts - - 0 0 -  PHQ-9 Score - - 0 7 -   No flowsheet data found.    Immunization History  Administered Date(s) Administered  . Influenza Inj Mdck Quad Pf 08/23/2018  . Influenza,inj,Quad PF,6+ Mos 11/17/2019  . Influenza,inj,quad, With Preservative 12/11/2014  . Influenza-Unspecified 08/31/2017, 09/11/2018  . PFIZER SARS-COV-2 Vaccination 01/17/2020, 02/08/2020  . Tdap 04/22/2016    Past Medical History:  Diagnosis Date  . Allergy   . Anemia   . Depression   . G6PD deficiency    Pt reported histroy from when she was in the [employer]. No records.   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 Vitamin D deficiency    Allergies  Allergen Reactions  . Flagyl [Metronidazole]   . Sulfa Antibiotics    Past Surgical History:  Procedure Laterality Date  . ABLATION    . lipo sucttion  2013  . WISDOM TOOTH EXTRACTION  1996   Family History  Problem Relation Age of Onset  . Hypertension Mother   . Hyperlipidemia Mother   . Protein S deficiency Father   . Heart disease Maternal Grandfather   . Arthritis Paternal Grandmother   . Hyperlipidemia Sister   . Breast cancer  Neg Hx    Social History   Social History Narrative   Married to [place]. They have 2 daughters Junie Panning and [place]).   Master's in Engineer, production. Works full time.   Drinks caffeine, takes a daily vitamin.    Exercises routinely.   Smoke detector in the home.   Firearms locked in the home.    Feels safe in her relationships.     Allergies as of 02/26/2020      Reactions   Flagyl [metronidazole]    Sulfa Antibiotics       Medication List       Accurate as of February 26, 2020 10:07 AM. If you have any questions, ask your nurse or doctor.        amLODipine 5 MG tablet Commonly known as: NORVASC Take 1 tablet (5 mg total) by mouth daily. What changed:   medication strength  how much  to take Changed by: Howard Pouch, DO   atorvastatin 40 MG tablet Commonly known as: LIPITOR Take 1 tablet (40 mg total) by mouth daily.   doxycycline 100 MG tablet Commonly known as: VIBRA-TABS Take 1 tablet (100 mg total) by mouth 2 (two) times daily. Started by: Howard Pouch, DO   fluticasone 50 MCG/ACT nasal spray Commonly known as: FLONASE Place into both nostrils daily as needed for allergies or rhinitis.   valACYclovir 500 MG tablet Commonly known as: VALTREX PRN       All past medical history, surgical history, allergies, family history, immunizations andmedications were updated in the EMR today and reviewed under the history and medication portions of their EMR.     Recent Results (from the past 2160 hour(s))  Cologuard     Status: None   Collection Time: 12/20/19  2:00 PM  Result Value Ref Range   Cologuard Negative Negative     ROS: 14 pt review of systems performed and negative (unless mentioned in an HPI)  Objective: BP (!) 145/78 (BP Location: Right Arm, Patient Position: Sitting, Cuff Size: Normal)   Pulse 95   Temp 98.2 F (36.8 C) (Temporal)   Resp 17   Ht 5' 1"  (1.549 m)   Wt 175 lb 4 oz (79.5 kg)   SpO2 100%   BMI 33.11 kg/m  Gen: Afebrile. No acute distress. Nontoxic in appearance, well developed. HENT: AT. [place]. Bilateral TM visualized and normal in appearance- mild fluid present nilateral. MMM. Bilateral nares with erythema, swelling and mild nasal drainage. Throat without erythema or exudates. No cough or hoarseness.  Eyes:Pupils Equal Round Reactive to light, Extraocular movements intact,  Conjunctiva without redness, discharge or icterus. Neck/lymp/endocrine: Supple,no lymphadenopathy CV: RRR, no edema Chest: CTAB, no wheeze or crackles Neuro:  Normal gait. PERLA. EOMi. Alert. Oriented x3    No exam data present  Assessment/plan: Mackenzie Jones is a 49 y.o. female present for  Essential hypertension/ Mixed hyperlipidemia/Obesity (BMI  30-39.9) Increase amlodipine 5 mg daily. Goal < 130/80 Low-sodium diet.  Routine exercise. Continue Lipitor 40 mg daily.   F/u 5.5 months.   Sinusitis: Exam is consistent with sinusitis and possible cause of headaches. If morning headaches do not improve with better control of BP and sinus tx> will need to consider other causes including sleep apnea. .  Rest, hydrate.  Continue  flonase and zyrtec> start  nasal saline.  doxy prescribed, take until completed.  F/U 2-4 weeks of not improved.    No follow-ups on file.  No orders of the defined types were placed in this  encounter.  Meds ordered this encounter  Medications  . amLODipine (NORVASC) 5 MG tablet    Sig: Take 1 tablet (5 mg total) by mouth daily.    Dispense:  90 tablet    Refill:  1    Please do not send request for renewal or a year supply. If pt needs refills more than prescribed,it is time for patients follow up with the provider and they should call the office for an appt. Thanks  . doxycycline (VIBRA-TABS) 100 MG tablet    Sig: Take 1 tablet (100 mg total) by mouth 2 (two) times daily.    Dispense:  20 tablet    Refill:  0   Referral Orders  No referral(s) requested today     Electronically signed by: Howard Pouch, Marie

## 2020-02-26 NOTE — Telephone Encounter (Signed)
[facility] computer was down this morning until about lunch time.  Patient was told to have doctor office resend rx's to pharmacy  Please resend prescription(s) that was sent today

## 2020-02-26 NOTE — Telephone Encounter (Signed)
[facility] with [facility] and RX was resent

## 2020-02-26 NOTE — Patient Instructions (Signed)
Increase amlodipine to 5 mg a day.  Treat headaches as sinus infection> if not improved over the next 4 weeks would need to consider other possible diagnosis.   Continue antihistamine and flonase.  Antibiotic prescribed.  Rinse with nasal saline daily.    Goal BP < 130/80.

## 2020-03-20 DIAGNOSIS — K602 Anal fissure, unspecified: Secondary | ICD-10-CM

## 2020-03-20 HISTORY — DX: Anal fissure, unspecified: K60.2

## 2020-03-26 ENCOUNTER — Encounter: Payer: Self-pay | Admitting: Family Medicine

## 2020-04-01 ENCOUNTER — Encounter: Payer: Self-pay | Admitting: Family Medicine

## 2020-04-02 MED ORDER — FLUCONAZOLE 150 MG PO TABS: 150.0000 mg | ORAL_TABLET | Freq: Once | ORAL | 0 refills | Status: AC

## 2020-04-02 NOTE — Addendum Note (Signed)
Addended by: Howard Pouch A on: 04/02/2020 07:40 AM   Modules accepted: Orders

## 2020-04-02 NOTE — Telephone Encounter (Signed)
Diflucan prescribed. If symptoms are not improved within 1 week or worsen, I encourage her to make an appt with her gyn or here, for pelvic exam.

## 2020-04-10 ENCOUNTER — Ambulatory Visit (INDEPENDENT_AMBULATORY_CARE_PROVIDER_SITE_OTHER): Payer: 59 | Admitting: Otolaryngology

## 2022-01-08 DIAGNOSIS — E785 Hyperlipidemia, unspecified: Secondary | ICD-10-CM | POA: Diagnosis not present

## 2022-01-15 DIAGNOSIS — H00011 Hordeolum externum right upper eyelid: Secondary | ICD-10-CM | POA: Diagnosis not present

## 2022-01-19 DIAGNOSIS — Z1231 Encounter for screening mammogram for malignant neoplasm of breast: Secondary | ICD-10-CM | POA: Diagnosis not present

## 2022-01-19 DIAGNOSIS — Z3040 Encounter for surveillance of contraceptives, unspecified: Secondary | ICD-10-CM | POA: Diagnosis not present

## 2022-01-19 DIAGNOSIS — Z1211 Encounter for screening for malignant neoplasm of colon: Secondary | ICD-10-CM | POA: Diagnosis not present

## 2022-01-19 DIAGNOSIS — Z01419 Encounter for gynecological examination (general) (routine) without abnormal findings: Secondary | ICD-10-CM | POA: Diagnosis not present

## 2022-06-08 ENCOUNTER — Encounter: Payer: Self-pay | Admitting: Nurse Practitioner

## 2022-06-08 ENCOUNTER — Ambulatory Visit (INDEPENDENT_AMBULATORY_CARE_PROVIDER_SITE_OTHER): Payer: 59 | Admitting: Nurse Practitioner

## 2022-06-08 ENCOUNTER — Other Ambulatory Visit: Payer: Self-pay

## 2022-06-08 VITALS — BP 136/84 | HR 100 | Temp 99.0°F | Resp 16 | Ht 60.0 in | Wt 183.4 lb

## 2022-06-08 DIAGNOSIS — I10 Essential (primary) hypertension: Secondary | ICD-10-CM

## 2022-06-08 DIAGNOSIS — Z8619 Personal history of other infectious and parasitic diseases: Secondary | ICD-10-CM | POA: Diagnosis not present

## 2022-06-08 DIAGNOSIS — E782 Mixed hyperlipidemia: Secondary | ICD-10-CM | POA: Diagnosis not present

## 2022-06-08 DIAGNOSIS — Z7689 Persons encountering health services in other specified circumstances: Secondary | ICD-10-CM | POA: Diagnosis not present

## 2022-06-08 DIAGNOSIS — E669 Obesity, unspecified: Secondary | ICD-10-CM

## 2022-06-08 MED ORDER — ROSUVASTATIN CALCIUM 5 MG PO TABS: 5.0000 mg | ORAL_TABLET | Freq: Every day | ORAL | 3 refills | Status: DC

## 2022-06-08 NOTE — Assessment & Plan Note (Signed)
Patient's current weight is 183 pounds with a BMI of 35.82.  Patient is working on eating healthier and working on increasing physical activity

## 2022-06-08 NOTE — Assessment & Plan Note (Signed)
Patient reports that she only has an outbreak maybe once a year.  Patient states that she has a prescription for Valtrex for outbreaks.

## 2022-06-08 NOTE — Assessment & Plan Note (Signed)
She is not currently on any medication.  Her blood pressure today is 136/84.  Patient denies any chest pain, shortness of breath, headaches or blurred vision.  We will continue with current plan.

## 2022-06-08 NOTE — Assessment & Plan Note (Signed)
Her last LDL was 111 on 11/17/2019.  We will get lab work at her next appointment.  She is currently taking rosuvastatin 5 mg at bedtime.  She denies any myalgia.

## 2022-06-08 NOTE — Progress Notes (Signed)
BP 136/84   Pulse 100   Temp 99 F (37.2 C) (Oral)   Resp 16   Ht 5' (1.524 m)   Wt 183 lb 6.4 oz (83.2 kg)   SpO2 98%   BMI 35.82 kg/m    Subjective:    Patient ID: Mackenzie Jones, female    DOB: 1971/10/30, 51 y.o.   MRN: [id number]  HPI: Mackenzie Jones is a 51 y.o. female here alone  Chief Complaint  Patient presents with   Establish Care   Hyperlipidemia   Establish care: Her last physical  08/2021.  She says that everything was okay.  She has a history of hyperlipidemia, and hypertension.  She is up to date on her mammogram, Cologuard, and Pap.  We will request records.  Hyperlipidemia: She is currently taking rosuvastin 5 mg at bedtime.  She denies any myalgia.  Her last LDL was 111 on 11/17/2019.  Per patient request we will get labs at her next appointment.  HTN: She used to be on amlodipine 5 mg daily.  She has been off medication since last year.  Her current blood pressure is 136/84.  She denies any chest pain, shortness of breath, headaches or blurred vision.  We will continue to monitor her blood pressure at her appointments.  HSV: She says that she gets maybe one out break a year.  Has not really been a problem for her.  Have a prescription for Valtrex for outbreaks.  Obesity: Her highest weight is 183 lbs with a BMI of 35.82. She says that she watches what she eats and she has now joined a group to start exercising.  We discussed even incorporating walking can help with weight loss.  Relevant past medical, surgical, family and social history reviewed and updated as indicated. Interim medical history since our last visit reviewed. Allergies and medications reviewed and updated.  Review of Systems  Constitutional: Negative for fever or weight change.  Respiratory: Negative for cough and shortness of breath.   Cardiovascular: Negative for chest pain or palpitations.  Gastrointestinal: Negative for abdominal pain, no bowel changes.  Musculoskeletal: Negative for gait  problem or joint swelling.  Skin: Negative for rash.  Neurological: Negative for dizziness or headache.  No other specific complaints in a complete review of systems (except as listed in HPI above).      Objective:    BP 136/84   Pulse 100   Temp 99 F (37.2 C) (Oral)   Resp 16   Ht 5' (1.524 m)   Wt 183 lb 6.4 oz (83.2 kg)   SpO2 98%   BMI 35.82 kg/m   Wt Readings from Last 3 Encounters:  06/08/22 183 lb 6.4 oz (83.2 kg)  02/26/20 175 lb 4 oz (79.5 kg)  11/17/19 173 lb 6 oz (78.6 kg)    Physical Exam  Constitutional: Patient appears well-developed and well-nourished. Obese  No distress.  HEENT: head atraumatic, normocephalic, pupils equal and reactive to light, neck supple Cardiovascular: Normal rate, regular rhythm and normal heart sounds.  No murmur heard. No BLE edema. Pulmonary/Chest: Effort normal and breath sounds normal. No respiratory distress. Abdominal: Soft.  There is no tenderness. Psychiatric: Patient has a normal mood and affect. behavior is normal. Judgment and thought content normal.  Results for orders placed or performed in visit on 12/25/19  Cologuard  Result Value Ref Range   Cologuard Negative Negative      Assessment & Plan:   Problem List Items Addressed This Visit  Cardiovascular and Mediastinum   Essential hypertension    She is not currently on any medication.  Her blood pressure today is 136/84.  Patient denies any chest pain, shortness of breath, headaches or blurred vision.  We will continue with current plan.      Relevant Medications   rosuvastatin (CRESTOR) 5 MG tablet     Other   History of PCR DNA positive for HSV2    Patient reports that she only has an outbreak maybe once a year.  Patient states that she has a prescription for Valtrex for outbreaks.      Obesity (BMI 30-39.9)    Patient's current weight is 183 pounds with a BMI of 35.82.  Patient is working on eating healthier and working on increasing physical  activity      Mixed hyperlipidemia - Primary    Her last LDL was 111 on 11/17/2019.  We will get lab work at her next appointment.  She is currently taking rosuvastatin 5 mg at bedtime.  She denies any myalgia.      Relevant Medications   rosuvastatin (CRESTOR) 5 MG tablet   Other Visit Diagnoses     Encounter to establish care       Schedule for CPE        Follow up plan: Return for cpe.

## 2022-06-26 ENCOUNTER — Encounter: Payer: 59 | Admitting: Nurse Practitioner

## 2022-07-24 ENCOUNTER — Ambulatory Visit (INDEPENDENT_AMBULATORY_CARE_PROVIDER_SITE_OTHER): Payer: 59 | Admitting: Nurse Practitioner

## 2022-07-24 ENCOUNTER — Encounter: Payer: Self-pay | Admitting: Nurse Practitioner

## 2022-07-24 ENCOUNTER — Other Ambulatory Visit: Payer: Self-pay

## 2022-07-24 VITALS — BP 120/72 | HR 83 | Temp 98.3°F | Resp 16 | Ht 60.0 in | Wt 182.8 lb

## 2022-07-24 DIAGNOSIS — Z1211 Encounter for screening for malignant neoplasm of colon: Secondary | ICD-10-CM | POA: Insufficient documentation

## 2022-07-24 DIAGNOSIS — Z0000 Encounter for general adult medical examination without abnormal findings: Secondary | ICD-10-CM | POA: Diagnosis not present

## 2022-07-24 DIAGNOSIS — Z114 Encounter for screening for human immunodeficiency virus [HIV]: Secondary | ICD-10-CM

## 2022-07-24 DIAGNOSIS — E782 Mixed hyperlipidemia: Secondary | ICD-10-CM | POA: Diagnosis not present

## 2022-07-24 DIAGNOSIS — R141 Gas pain: Secondary | ICD-10-CM | POA: Insufficient documentation

## 2022-07-24 DIAGNOSIS — Z131 Encounter for screening for diabetes mellitus: Secondary | ICD-10-CM

## 2022-07-24 DIAGNOSIS — E669 Obesity, unspecified: Secondary | ICD-10-CM

## 2022-07-24 DIAGNOSIS — K602 Anal fissure, unspecified: Secondary | ICD-10-CM | POA: Insufficient documentation

## 2022-07-24 DIAGNOSIS — I10 Essential (primary) hypertension: Secondary | ICD-10-CM

## 2022-07-24 DIAGNOSIS — Z1159 Encounter for screening for other viral diseases: Secondary | ICD-10-CM

## 2022-07-24 DIAGNOSIS — K629 Disease of anus and rectum, unspecified: Secondary | ICD-10-CM | POA: Insufficient documentation

## 2022-07-24 DIAGNOSIS — K649 Unspecified hemorrhoids: Secondary | ICD-10-CM | POA: Insufficient documentation

## 2022-07-24 DIAGNOSIS — K625 Hemorrhage of anus and rectum: Secondary | ICD-10-CM | POA: Insufficient documentation

## 2022-07-24 NOTE — Progress Notes (Signed)
Name: Mackenzie Jones   MRN: [id number]    DOB: 06/27/1971   Date:07/24/2022       Progress Note  Subjective  Chief Complaint  Chief Complaint  Patient presents with   Annual Exam    HPI  Patient presents for annual CPE.  Diet: Well balanced diet, lean meats, veggies,fruits, does not like fish, tries to limit carbs and junk Exercise: walking twice a week for about 30 min, recommend increasing physical activity to 150 min a week Sleep: 7 Last dental exam:she gets one every six months, she has one coming up  Last eye exam: earlier this week  [place] Visit [facility] 07/24/[facility]  AUDIT-C Score 0      Depression: Phq 9 is  negative    07/24/2022   10:38 AM 06/08/2022    3:42 PM 11/17/2019    9:05 AM 10/31/2018    5:18 PM 10/19/2017    8:39 AM  Depression screen PHQ 2/9  Decreased Interest 0 0 0 0 0  Down, Depressed, Hopeless 0 0 0 0 0  PHQ - 2 Score 0 0 0 0 0  Altered sleeping     0  Tired, decreased energy     0  Change in appetite     0  Feeling bad or failure about yourself      0  Trouble concentrating     0  Moving slowly or fidgety/restless     0  Suicidal thoughts     0  PHQ-9 Score     0   Hypertension: BP Readings from Last 3 Encounters:  07/24/22 120/72  06/08/22 136/84  02/26/20 (!) 145/78   Obesity: Wt Readings from Last 3 Encounters:  07/24/22 182 lb 12.8 oz (82.9 kg)  06/08/22 183 lb 6.4 oz (83.2 kg)  02/26/20 175 lb 4 oz (79.5 kg)   BMI Readings from Last 3 Encounters:  07/24/22 35.70 kg/m  06/08/22 35.82 kg/m  02/26/20 33.11 kg/m     Vaccines:  HPV: up to at age 42 , ask insurance if age between 20-45  Shingrix: 66-64 yo and ask insurance if covered when patient above 19 yo Pneumonia:  educated and discussed with patient. Flu:  educated and discussed with patient.  Hep C Screening: ordered STD testing and prevention (HIV/chl/gon/syphilis): ordered Intimate partner violence:none Sexual History :  yes Menstrual History/LMP/Abnormal Bleeding: postmenopausal Incontinence Symptoms: some stress incontinence  Breast cancer:  - Last Mammogram: 01/16/2022 - BRCA gene screening: none  Osteoporosis: Discussed high calcium and vitamin D supplementation, weight bearing exercises  Cervical cancer screening: 01/16/2022  Skin cancer: Discussed monitoring for atypical lesions  Colorectal cancer: cologuard done 12/20/2019   Lung cancer:   Low Dose CT Chest recommended if Age 33-80 years, 20 pack-year currently smoking OR have quit w/in 15years. Patient does not qualify.   ECG: 02/02/2017  Advanced Care Planning: A voluntary discussion about advance care planning including the explanation and discussion of advance directives.  Discussed health care proxy and Living will, and the patient was able to identify a health care proxy as husband.  Patient does have a living will at present time. If patient does have living will, I have requested they bring this to the clinic to be scanned in to their chart.  Lipids: Lab Results  Component Value Date   CHOL 177 11/17/2019   CHOL 280 (H) 10/31/2018   CHOL 233 (H) 10/19/2017   Lab Results  Component Value Date  HDL 47.90 11/17/2019   HDL 53 10/31/2018   HDL 50.50 10/19/2017   Lab Results  Component Value Date   LDLCALC 111 (H) 11/17/2019   LDLCALC 202 (H) 10/31/2018   LDLCALC 160 (H) 10/19/2017   Lab Results  Component Value Date   TRIG 87.0 11/17/2019   TRIG 117 10/31/2018   TRIG 113.0 10/19/2017   Lab Results  Component Value Date   CHOLHDL 4 11/17/2019   CHOLHDL 5.3 (H) 10/31/2018   CHOLHDL 5 10/19/2017   No results found for: "LDLDIRECT"  Glucose: Glucose, Bld  Date Value Ref Range Status  11/17/2019 97 70 - 99 mg/dL Final  10/31/2018 78 65 - 99 mg/dL Final    Comment:    .            Fasting reference interval .   10/19/2017 101 (H) 70 - 99 mg/dL Final    Patient Active Problem List   Diagnosis Date Noted   Anal fissure  07/24/2022   Anorectal disorder 07/24/2022   Bleeding per rectum 07/24/2022   Flatulence, eructation and gas pain 07/24/2022   Hemorrhoids 07/24/2022   Essential hypertension 11/17/2019   Mixed hyperlipidemia 11/17/2019   Fibroma 01/19/2019   Family history of protein S deficiency 10/19/2017   Obesity (BMI 30-39.9) 04/06/2017   Vitamin D deficiency 01/18/2017   History of PCR DNA positive for HSV2    Right arm pain 10/01/2015    Past Surgical History:  Procedure Laterality Date   ABLATION     lipo sucttion  2013   WISDOM TOOTH EXTRACTION  1996    Family History  Problem Relation Age of Onset   Hypertension Mother    Hyperlipidemia Mother    Protein S deficiency Father    Heart disease Maternal Grandfather    Arthritis Paternal Grandmother    Hyperlipidemia Sister    Breast cancer Neg Hx     Social History   Socioeconomic History   Marital status: Married    Spouse name: brian   Number of children: 2   Years of education: Masters   Highest education level: Not on file  Occupational History   Occupation: Chief Financial Officer  Tobacco Use   Smoking status: Never   Smokeless tobacco: Never  Vaping Use   Vaping Use: Never used  Substance and Sexual Activity   Alcohol use: No   Drug use: No   Sexual activity: Yes    Partners: Male    Birth control/protection: Surgical    Comment: vas  Other Topics Concern   Not on file  Social History Narrative   Married to [place]. They have 2 daughters Junie Panning and [place]).   Master's in Engineer, production. Works full time.   Drinks caffeine, takes a daily vitamin.    Exercises routinely.   Smoke detector in the home.   Firearms locked in the home.    Feels safe in her relationships.    Works at [employer]: Low Risk  (07/24/2022)   Overall Financial Resource Strain (CARDIA)    Difficulty of Paying Living Expenses: Not hard at all  Food Insecurity: No Food Insecurity (07/24/2022)    Hunger Vital Sign    Worried About Running Out of Food in the Last Year: Never true    Ran Out of Food in the Last Year: Never true  Transportation Needs: No Transportation Needs (07/24/2022)   PRAPARE - Hydrologist (Medical): No  Lack of Transportation (Non-Medical): No  Physical Activity: Insufficiently Active (07/24/2022)   Exercise Vital Sign    Days of Exercise per Week: 2 days    Minutes of Exercise per Session: 20 min  Stress: No Stress Concern Present (07/24/2022)   [place]    Feeling of Stress : Not at all  Social Connections: [place] (07/24/2022)   Social Connection and Isolation Panel [NHANES]    Frequency of Communication with Friends and Family: More than three times a week    Frequency of Social Gatherings with Friends and Family: More than three times a week    Attends Religious Services: 1 to 4 times per year    Active Member of [employer] or Organizations: Yes    Attends Archivist Meetings: 1 to 4 times per year    Marital Status: Married  Human resources officer Violence: Not At Risk (07/24/2022)   Humiliation, Afraid, Rape, and Kick questionnaire    Fear of Current or Ex-Partner: No    Emotionally Abused: No    Physically Abused: No    Sexually Abused: No     Current Outpatient Medications:    clobetasol (TEMOVATE) 0.05 % external solution, Apply topically 2 (two) times daily., Disp: , Rfl:    fluticasone (FLONASE) 50 MCG/ACT nasal spray, Place into both nostrils daily as needed for allergies or rhinitis., Disp: , Rfl:    rosuvastatin (CRESTOR) 5 MG tablet, Take 1 tablet (5 mg total) by mouth at bedtime., Disp: 90 tablet, Rfl: 3   valACYclovir (VALTREX) 500 MG tablet, PRN, Disp: , Rfl:    clonazePAM (KLONOPIN) 1 MG tablet, , Disp: , Rfl:    diclofenac Sodium (VOLTAREN) 1 % GEL, Apply 2 g topically 4 (four) times daily. (Patient not taking: Reported on  07/24/2022), Disp: , Rfl:    fluticasone (FLONASE) 50 MCG/ACT nasal spray, Place into the nose. (Patient not taking: Reported on 07/24/2022), Disp: , Rfl:    lidocaine (XYLOCAINE) 5 % ointment, , Disp: , Rfl:    Multiple Vitamin (MULTIVITAMIN) capsule, Take 1 capsule by mouth daily. (Patient not taking: Reported on 07/24/2022), Disp: , Rfl:   Allergies  Allergen Reactions   Prednisone Hives   Flagyl [Metronidazole]    Sulfa Antibiotics      ROS  Constitutional: Negative for fever or weight change.  Respiratory: Negative for cough and shortness of breath.   Cardiovascular: Negative for chest pain or palpitations.  Gastrointestinal: Negative for abdominal pain, no bowel changes.  Musculoskeletal: Negative for gait problem or joint swelling.  Skin: Negative for rash.  Neurological: Negative for dizziness or headache.  No other specific complaints in a complete review of systems (except as listed in HPI above).   Objective  Vitals:   07/24/22 1036  BP: 120/72  Pulse: 83  Resp: 16  Temp: 98.3 F (36.8 C)  TempSrc: Oral  SpO2: 98%  Weight: 182 lb 12.8 oz (82.9 kg)  Height: 5' (1.524 m)    Body mass index is 35.7 kg/m.  Physical Exam Constitutional: Patient appears well-developed and well-nourished. No distress.  HENT: Head: Normocephalic and atraumatic. Ears: B TMs ok, no erythema or effusion; Nose: Nose normal. Mouth/Throat: Oropharynx is clear and moist. No oropharyngeal exudate.  Eyes: Conjunctivae and EOM are normal. Pupils are equal, round, and reactive to light. No scleral icterus.  Neck: Normal range of motion. Neck supple. No JVD present. No thyromegaly present.  Cardiovascular: Normal rate, regular rhythm and normal  heart sounds.  No murmur heard. No BLE edema. Pulmonary/Chest: Effort normal and breath sounds normal. No respiratory distress. Abdominal: Soft. Bowel sounds are normal, no distension. There is no tenderness. no masses Breast: no lumps or masses, no  nipple discharge or rashes Musculoskeletal: Normal range of motion, no joint effusions. No gross deformities Neurological: he is alert and oriented to person, place, and time. No cranial nerve deficit. Coordination, balance, strength, speech and gait are normal.  Skin: Skin is warm and dry. No rash noted. No erythema.  Psychiatric: Patient has a normal mood and affect. behavior is normal. Judgment and thought content normal.   No results found for this or any previous visit (from the past 2160 hour(s)).    Fall Risk:    07/24/2022   10:37 AM 06/08/2022    3:41 PM 10/31/2018    5:18 PM 10/19/2017    8:39 AM 01/18/2017    1:10 PM  [place] in the past year? 0 0 0 No No  Number falls in past yr: 0 0     Injury with Fall? 0 0     Follow up Falls evaluation completed Falls evaluation completed        Functional Status Survey: Is the patient deaf or have difficulty hearing?: No Does the patient have difficulty seeing, even when wearing glasses/contacts?: No Does the patient have difficulty concentrating, remembering, or making decisions?: No Does the patient have difficulty walking or climbing stairs?: No Does the patient have difficulty dressing or bathing?: No Does the patient have difficulty doing errands alone such as visiting a doctor's office or shopping?: No   Assessment & Plan  1. Annual physical exam  - Lipid panel - CBC with Differential/Platelet - COMPLETE METABOLIC PANEL WITH GFR - Hemoglobin A1c - Hepatitis C antibody - HIV Antibody (routine testing w rflx)  2. Mixed hyperlipidemia Continue taking rosuvastatin 5 mg daily. - Lipid panel - COMPLETE METABOLIC PANEL WITH GFR  3. Essential hypertension Blood pressure at goal. - CBC with Differential/Platelet - COMPLETE METABOLIC PANEL WITH GFR  4. Obesity (BMI 30-39.9) Continue to work on diet and exercise  5. Encounter for hepatitis C screening test for low risk patient  - Hepatitis C  antibody  6. Screening for HIV without presence of risk factors  - HIV Antibody (routine testing w rflx)  7. Screening for diabetes mellitus  - COMPLETE METABOLIC PANEL WITH GFR - Hemoglobin A1c   -USPSTF grade A and B recommendations reviewed with patient; age-appropriate recommendations, preventive care, screening tests, etc discussed and encouraged; healthy living encouraged; see AVS for patient education given to patient -Discussed importance of 150 minutes of physical activity weekly, eat two servings of fish weekly, eat one serving of tree nuts ( cashews, pistachios, pecans, almonds.Marland Kitchen) every other day, eat 6 servings of fruit/vegetables daily and drink plenty of water and avoid sweet beverages.

## 2022-07-27 ENCOUNTER — Encounter: Payer: Self-pay | Admitting: Nurse Practitioner

## 2022-07-27 LAB — COMPLETE METABOLIC PANEL WITH GFR
AG Ratio: 1.6 (calc) (ref 1.0–2.5)
ALT: 16 U/L (ref 6–29)
AST: 19 U/L (ref 10–35)
Albumin: 4.4 g/dL (ref 3.6–5.1)
Alkaline phosphatase (APISO): 118 U/L (ref 37–153)
BUN: 14 mg/dL (ref 7–25)
CO2: 23 mmol/L (ref 20–32)
Calcium: 9.4 mg/dL (ref 8.6–10.4)
Chloride: 107 mmol/L (ref 98–110)
Creat: 0.86 mg/dL (ref 0.50–1.03)
Globulin: 2.7 g/dL (calc) (ref 1.9–3.7)
Glucose, Bld: 90 mg/dL (ref 65–99)
Potassium: 4.3 mmol/L (ref 3.5–5.3)
Sodium: 140 mmol/L (ref 135–146)
Total Bilirubin: 0.4 mg/dL (ref 0.2–1.2)
Total Protein: 7.1 g/dL (ref 6.1–8.1)
eGFR: 81 mL/min/{1.73_m2} (ref >=60)

## 2022-07-27 LAB — CBC WITH DIFFERENTIAL/PLATELET
Absolute Monocytes: 374 cells/uL (ref 200–950)
Basophils Absolute: 47 cells/uL (ref 0–200)
Basophils Relative: 0.6 %
Eosinophils Absolute: 117 cells/uL (ref 15–500)
Eosinophils Relative: 1.5 %
HCT: 37.4 % (ref 35.0–45.0)
Hemoglobin: 12.7 g/dL (ref 11.7–15.5)
Lymphs Abs: 3822 cells/uL (ref 850–3900)
MCH: 28.9 pg (ref 27.0–33.0)
MCHC: 34 g/dL (ref 32.0–36.0)
MCV: 85 fL (ref 80.0–100.0)
MPV: 9.9 fL (ref 7.5–12.5)
Monocytes Relative: 4.8 %
Neutro Abs: 3440 cells/uL (ref 1500–7800)
Neutrophils Relative %: 44.1 %
Platelets: 325 10*3/uL (ref 140–400)
RBC: 4.4 10*6/uL (ref 3.80–5.10)
RDW: 12.2 % (ref 11.0–15.0)
Total Lymphocyte: 49 %
WBC: 7.8 10*3/uL (ref 3.8–10.8)

## 2022-07-27 LAB — HEPATITIS C ANTIBODY: Hepatitis C Ab: NONREACTIVE

## 2022-07-27 LAB — LIPID PANEL
Cholesterol: 188 mg/dL (ref <200)
HDL: 53 mg/dL (ref >=50)
LDL Cholesterol (Calc): 114 mg/dL (calc) — ABNORMAL HIGH
Non-HDL Cholesterol (Calc): 135 mg/dL (calc) — ABNORMAL HIGH (ref <130)
Total CHOL/HDL Ratio: 3.5 (calc) (ref <5.0)
Triglycerides: 103 mg/dL (ref <150)

## 2022-07-27 LAB — HIV ANTIBODY (ROUTINE TESTING W REFLEX): HIV 1&2 Ab, 4th Generation: NONREACTIVE

## 2022-07-27 LAB — HEMOGLOBIN A1C
Hgb A1c MFr Bld: 5.4 % of total Hgb (ref <5.7)
Mean Plasma Glucose: 108 mg/dL
eAG (mmol/L): 6 mmol/L

## 2022-08-26 ENCOUNTER — Other Ambulatory Visit (HOSPITAL_COMMUNITY)
Admission: RE | Admit: 2022-08-26 | Discharge: 2022-08-26 | Disposition: A | Discharge status: Home / Self Care | Payer: 59 | Source: Ambulatory Visit | Attending: Nurse Practitioner | Admitting: Nurse Practitioner

## 2022-08-26 ENCOUNTER — Encounter: Payer: Self-pay | Admitting: Nurse Practitioner

## 2022-08-26 ENCOUNTER — Ambulatory Visit (INDEPENDENT_AMBULATORY_CARE_PROVIDER_SITE_OTHER): Payer: 59 | Admitting: Nurse Practitioner

## 2022-08-26 VITALS — BP 122/78 | HR 97 | Temp 98.2°F | Resp 16 | Ht 60.0 in | Wt 188.4 lb

## 2022-08-26 DIAGNOSIS — J0110 Acute frontal sinusitis, unspecified: Secondary | ICD-10-CM | POA: Diagnosis not present

## 2022-08-26 DIAGNOSIS — N898 Other specified noninflammatory disorders of vagina: Secondary | ICD-10-CM | POA: Diagnosis not present

## 2022-08-26 DIAGNOSIS — E782 Mixed hyperlipidemia: Secondary | ICD-10-CM | POA: Diagnosis not present

## 2022-08-26 MED ORDER — ROSUVASTATIN CALCIUM 5 MG PO TABS: 5.0000 mg | ORAL_TABLET | Freq: Every day | ORAL | 3 refills | Status: DC

## 2022-08-26 MED ORDER — AMOXICILLIN-POT CLAVULANATE 875-125 MG PO TABS: 1.0000 | ORAL_TABLET | Freq: Two times a day (BID) | ORAL | 0 refills | Status: AC

## 2022-08-26 NOTE — Progress Notes (Signed)
BP 122/78   Pulse 97   Temp 98.2 F (36.8 C)   Resp 16   Ht 5' (1.524 m)   Wt 188 lb 6.4 oz (85.5 kg)   SpO2 97%   BMI 36.79 kg/m    Subjective:    Patient ID: Mackenzie Jones, female    DOB: 1971/01/20, 51 y.o.   MRN: [id number]  HPI: Mackenzie Jones is a 51 y.o. female  Chief Complaint  Patient presents with   Vaginal Discharge   Vaginal discharge: patient reports she has had vaginal discharge for about a month. She says that it is clear.  She is sexually active.  Denies any blood, pain or vaginal itching.  We will get vaginal swab and treat as indicated.  Nasal congestion:  She says that she has nasal congestion for about three weeks.  She reports she did have a cough but that has improved.  Patient denies any fever or shortness of breath.  She says she was taking mucinex, flonase and xyzal.  Patient reports even with these treatments she has continued to have sinus congestion.  No facial pressure. Discussed that likely viral not bacterial sinus infection.  Patient unable to take prednisone. Will send in prescription to take if no improvement.   Hyperlipidemia: Patient is currently taking rosuvastatin 5 mg daily.  Patient denies any myalgia.  Patient states she does need a refill.  Refill sent.  Relevant past medical, surgical, family and social history reviewed and updated as indicated. Interim medical history since our last visit reviewed. Allergies and medications reviewed and updated.  Review of Systems  Constitutional: Negative for fever or weight change.  HEENT: positive for nasal congestion Respiratory: Negative for cough and shortness of breath.   Cardiovascular: Negative for chest pain or palpitations.  Gastrointestinal: Negative for abdominal pain, no bowel changes.  Musculoskeletal: Negative for gait problem or joint swelling.  Skin: Negative for rash.  Neurological: Negative for dizziness or headache.  No other specific complaints in a complete review of systems  (except as listed in HPI above).      Objective:    BP 122/78   Pulse 97   Temp 98.2 F (36.8 C)   Resp 16   Ht 5' (1.524 m)   Wt 188 lb 6.4 oz (85.5 kg)   SpO2 97%   BMI 36.79 kg/m   Wt Readings from Last 3 Encounters:  08/26/22 188 lb 6.4 oz (85.5 kg)  07/24/22 182 lb 12.8 oz (82.9 kg)  06/08/22 183 lb 6.4 oz (83.2 kg)    Physical Exam  Constitutional: Patient appears well-developed and well-nourished. Obese  No distress.  HEENT: head atraumatic, normocephalic, pupils equal and reactive to light, neck supple Cardiovascular: Normal rate, regular rhythm and normal heart sounds.  No murmur heard. No BLE edema. Pulmonary/Chest: Effort normal and breath sounds normal. No respiratory distress. Abdominal: Soft.  There is no tenderness. Psychiatric: Patient has a normal mood and affect. behavior is normal. Judgment and thought content normal.  Results for orders placed or performed in visit on 07/24/22  Lipid panel  Result Value Ref Range   Cholesterol 188 <200 mg/dL   HDL 53 > OR = 50 mg/dL   Triglycerides 103 <150 mg/dL   LDL Cholesterol (Calc) 114 (H) mg/dL (calc)   Total CHOL/HDL Ratio 3.5 <5.0 (calc)   Non-HDL Cholesterol (Calc) 135 (H) <130 mg/dL (calc)  CBC with Differential/Platelet  Result Value Ref Range   WBC 7.8 3.8 - 10.8 Thousand/uL  RBC 4.40 3.80 - 5.10 Million/uL   Hemoglobin 12.7 11.7 - 15.5 g/dL   HCT 37.4 35.0 - 45.0 %   MCV 85.0 80.0 - 100.0 fL   MCH 28.9 27.0 - 33.0 pg   MCHC 34.0 32.0 - 36.0 g/dL   RDW 12.2 11.0 - 15.0 %   Platelets 325 140 - 400 Thousand/uL   MPV 9.9 7.5 - 12.5 fL   Neutro Abs 3,440 1,500 - 7,800 cells/uL   Lymphs Abs 3,822 850 - 3,900 cells/uL   Absolute Monocytes 374 200 - 950 cells/uL   Eosinophils Absolute 117 15 - 500 cells/uL   Basophils Absolute 47 0 - 200 cells/uL   Neutrophils Relative % 44.1 %   Total Lymphocyte 49.0 %   Monocytes Relative 4.8 %   Eosinophils Relative 1.5 %   Basophils Relative 0.6 %  COMPLETE  METABOLIC PANEL WITH GFR  Result Value Ref Range   Glucose, Bld 90 65 - 99 mg/dL   BUN 14 7 - 25 mg/dL   Creat 0.86 0.50 - 1.03 mg/dL   eGFR 81 > OR = 60 mL/min/1.72m   BUN/Creatinine Ratio SEE NOTE: 6 - 22 (calc)   Sodium 140 135 - 146 mmol/L   Potassium 4.3 3.5 - 5.3 mmol/L   Chloride 107 98 - 110 mmol/L   CO2 23 20 - 32 mmol/L   Calcium 9.4 8.6 - 10.4 mg/dL   Total Protein 7.1 6.1 - 8.1 g/dL   Albumin 4.4 3.6 - 5.1 g/dL   Globulin 2.7 1.9 - 3.7 g/dL (calc)   AG Ratio 1.6 1.0 - 2.5 (calc)   Total Bilirubin 0.4 0.2 - 1.2 mg/dL   Alkaline phosphatase (APISO) 118 37 - 153 U/L   AST 19 10 - 35 U/L   ALT 16 6 - 29 U/L  Hemoglobin A1c  Result Value Ref Range   Hgb A1c MFr Bld 5.4 <5.7 % of total Hgb   Mean Plasma Glucose 108 mg/dL   eAG (mmol/L) 6.0 mmol/L  Hepatitis C antibody  Result Value Ref Range   Hepatitis C Ab NON-REACTIVE NON-REACTIVE  HIV Antibody (routine testing w rflx)  Result Value Ref Range   HIV 1&2 Ab, 4th Generation NON-REACTIVE NON-REACTIVE      Assessment & Plan:   Problem List Items Addressed This Visit       Other   Mixed hyperlipidemia    Continue  taking rosuvastatin 5 mg daily.      Relevant Medications   rosuvastatin (CRESTOR) 5 MG tablet   Other Visit Diagnoses     Vaginal discharge    -  Primary   Obtain vaginal swab treat as indicated.   Relevant Orders   Cervicovaginal ancillary only   Acute non-recurrent frontal sinusitis       Continue taking Flonase, Mucinex and Xyzal.  May add on Zyrtec or Claritin in the morning.  Sending in antibiotic if no improvement can take that.   Relevant Medications   amoxicillin-clavulanate (AUGMENTIN) 875-125 MG tablet        Follow up plan: Return if symptoms worsen or fail to improve.

## 2022-08-26 NOTE — Assessment & Plan Note (Signed)
Continue  taking rosuvastatin 5 mg daily.

## 2022-08-28 LAB — CERVICOVAGINAL ANCILLARY ONLY
Bacterial Vaginitis (gardnerella): NEGATIVE
Candida Glabrata: NEGATIVE
Candida Vaginitis: NEGATIVE
Chlamydia: NEGATIVE
Comment: NEGATIVE
Comment: NEGATIVE
Comment: NEGATIVE
Comment: NEGATIVE
Comment: NEGATIVE
Comment: NORMAL
Neisseria Gonorrhea: NEGATIVE
Trichomonas: NEGATIVE

## 2023-01-24 NOTE — Progress Notes (Unsigned)
There were no vitals taken for this visit.   Subjective:    Patient ID: Mackenzie Jones, female    DOB: May 01, 1971, 52 y.o.   MRN: [id number]  HPI: Mackenzie Jones is a 52 y.o. female here alone  No chief complaint on file.   Hyperlipidemia: her last LDL was 114 on 07/24/2022. She currently takes rosuvastatin 5 mg at bedtime. She denies any myalgia.   The 10-year ASCVD risk score (Arnett DK, et al., 2019) is: 2%   Values used to calculate the score:     Age: 46 years     Sex: Female     Is Non-Hispanic African American: Yes     Diabetic: No     Tobacco smoker: No     Systolic Blood Pressure: 123XX123 mmHg     Is BP treated: No     HDL Cholesterol: 53 mg/dL     Total Cholesterol: 188 mg/dL   HTN: her blood pressure today is ***.  She denies any shortness of breath, headaches, blurred vision or chest pain. She is not currently on blood pressure medication, she was on amlodipine 5 mg daily but has not been on it since 2022.  HSV: She says that she gets maybe one out break a year.  Has not really been a problem for her. She has a prescription for valtrex as needed for outbreaks.    Obesity: her weight today is *** with a BMI of ***.  She has been working on lifestyle modification including exercise and watching what she eats.   Relevant past medical, surgical, family and social history reviewed and updated as indicated. Interim medical history since our last visit reviewed. Allergies and medications reviewed and updated.  Review of Systems  Constitutional: Negative for fever or weight change.  Respiratory: Negative for cough and shortness of breath.   Cardiovascular: Negative for chest pain or palpitations.  Gastrointestinal: Negative for abdominal pain, no bowel changes.  Musculoskeletal: Negative for gait problem or joint swelling.  Skin: Negative for rash.  Neurological: Negative for dizziness or headache.  No other specific complaints in a complete review of systems (except as listed  in HPI above).      Objective:    There were no vitals taken for this visit.  Wt Readings from Last 3 Encounters:  08/26/22 188 lb 6.4 oz (85.5 kg)  07/24/22 182 lb 12.8 oz (82.9 kg)  06/08/22 183 lb 6.4 oz (83.2 kg)    Physical Exam  Constitutional: Patient appears well-developed and well-nourished. Obese  No distress.  HEENT: head atraumatic, normocephalic, pupils equal and reactive to light, neck supple Cardiovascular: Normal rate, regular rhythm and normal heart sounds.  No murmur heard. No BLE edema. Pulmonary/Chest: Effort normal and breath sounds normal. No respiratory distress. Abdominal: Soft.  There is no tenderness. Psychiatric: Patient has a normal mood and affect. behavior is normal. Judgment and thought content normal.  Results for orders placed or performed in visit on 08/26/22  Cervicovaginal ancillary only  Result Value Ref Range   Neisseria Gonorrhea Negative    Chlamydia Negative    Trichomonas Negative    Bacterial Vaginitis (gardnerella) Negative    Candida Vaginitis Negative    Candida Glabrata Negative    Comment      Normal Reference Range Bacterial Vaginosis - Negative   Comment Normal Reference Range Candida Species - Negative    Comment Normal Reference Range Candida Galbrata - Negative    Comment Normal Reference Range Trichomonas - Negative  Comment Normal Reference Ranger Chlamydia - Negative    Comment      Normal Reference Range Neisseria Gonorrhea - Negative      Assessment & Plan:   Problem List Items Addressed This Visit   None    Follow up plan: No follow-ups on file.

## 2023-01-25 ENCOUNTER — Encounter: Payer: Self-pay | Admitting: Nurse Practitioner

## 2023-01-25 ENCOUNTER — Other Ambulatory Visit: Payer: Self-pay

## 2023-01-25 ENCOUNTER — Ambulatory Visit: Payer: Self-pay | Admitting: Nurse Practitioner

## 2023-01-25 VITALS — BP 122/68 | HR 98 | Temp 98.0°F | Resp 16 | Ht 60.0 in | Wt 152.6 lb

## 2023-01-25 DIAGNOSIS — Z131 Encounter for screening for diabetes mellitus: Secondary | ICD-10-CM

## 2023-01-25 DIAGNOSIS — I10 Essential (primary) hypertension: Secondary | ICD-10-CM

## 2023-01-25 DIAGNOSIS — E782 Mixed hyperlipidemia: Secondary | ICD-10-CM

## 2023-01-25 DIAGNOSIS — E663 Overweight: Secondary | ICD-10-CM

## 2023-01-25 DIAGNOSIS — Z1211 Encounter for screening for malignant neoplasm of colon: Secondary | ICD-10-CM

## 2023-01-25 NOTE — Assessment & Plan Note (Signed)
Patient stopped rosuvastatin in November, will check labs today

## 2023-01-25 NOTE — Assessment & Plan Note (Signed)
Blood pressure at goal, not currently on medications

## 2023-01-25 NOTE — Assessment & Plan Note (Signed)
Patient has worked really hard to lose weight and is no longer classified as obese.

## 2023-01-26 LAB — CBC WITH DIFFERENTIAL/PLATELET
Absolute Monocytes: 391 cells/uL (ref 200–950)
Basophils Absolute: 51 cells/uL (ref 0–200)
Basophils Relative: 0.6 %
Eosinophils Absolute: 102 cells/uL (ref 15–500)
Eosinophils Relative: 1.2 %
HCT: 38.8 % (ref 35.0–45.0)
Hemoglobin: 12.8 g/dL (ref 11.7–15.5)
Lymphs Abs: 3349 cells/uL (ref 850–3900)
MCH: 28.4 pg (ref 27.0–33.0)
MCHC: 33 g/dL (ref 32.0–36.0)
MCV: 86.2 fL (ref 80.0–100.0)
MPV: 9.8 fL (ref 7.5–12.5)
Monocytes Relative: 4.6 %
Neutro Abs: 4607 cells/uL (ref 1500–7800)
Neutrophils Relative %: 54.2 %
Platelets: 399 10*3/uL (ref 140–400)
RBC: 4.5 10*6/uL (ref 3.80–5.10)
RDW: 12.3 % (ref 11.0–15.0)
Total Lymphocyte: 39.4 %
WBC: 8.5 10*3/uL (ref 3.8–10.8)

## 2023-01-26 LAB — COMPLETE METABOLIC PANEL WITH GFR
AG Ratio: 1.6 (calc) (ref 1.0–2.5)
ALT: 7 U/L (ref 6–29)
AST: 13 U/L (ref 10–35)
Albumin: 4.4 g/dL (ref 3.6–5.1)
Alkaline phosphatase (APISO): 102 U/L (ref 37–153)
BUN: 21 mg/dL (ref 7–25)
CO2: 24 mmol/L (ref 20–32)
Calcium: 9.3 mg/dL (ref 8.6–10.4)
Chloride: 106 mmol/L (ref 98–110)
Creat: 0.92 mg/dL (ref 0.50–1.03)
Globulin: 2.8 g/dL (calc) (ref 1.9–3.7)
Glucose, Bld: 86 mg/dL (ref 65–99)
Potassium: 4 mmol/L (ref 3.5–5.3)
Sodium: 140 mmol/L (ref 135–146)
Total Bilirubin: 0.4 mg/dL (ref 0.2–1.2)
Total Protein: 7.2 g/dL (ref 6.1–8.1)
eGFR: 75 mL/min/{1.73_m2} (ref >=60)

## 2023-01-26 LAB — HEMOGLOBIN A1C
Hgb A1c MFr Bld: 5.1 % of total Hgb (ref <5.7)
Mean Plasma Glucose: 100 mg/dL
eAG (mmol/L): 5.5 mmol/L

## 2023-01-26 LAB — LIPID PANEL
Cholesterol: 223 mg/dL — ABNORMAL HIGH (ref <200)
HDL: 46 mg/dL — ABNORMAL LOW (ref >=50)
LDL Cholesterol (Calc): 155 mg/dL (calc) — ABNORMAL HIGH
Non-HDL Cholesterol (Calc): 177 mg/dL (calc) — ABNORMAL HIGH (ref <130)
Total CHOL/HDL Ratio: 4.8 (calc) (ref <5.0)
Triglycerides: 105 mg/dL (ref <150)

## 2023-01-27 ENCOUNTER — Other Ambulatory Visit: Payer: Self-pay | Admitting: *Deleted

## 2023-01-27 ENCOUNTER — Encounter: Payer: Self-pay | Admitting: *Deleted

## 2023-01-27 ENCOUNTER — Telehealth: Payer: Self-pay | Admitting: *Deleted

## 2023-01-27 DIAGNOSIS — Z1211 Encounter for screening for malignant neoplasm of colon: Secondary | ICD-10-CM

## 2023-01-27 MED ORDER — NA SULFATE-K SULFATE-MG SULF 17.5-3.13-1.6 GM/177ML PO SOLN: 1.0000 | Freq: Once | ORAL | 0 refills | Status: AC

## 2023-01-27 NOTE — Telephone Encounter (Signed)
Duplicate encounter.  Error

## 2023-01-27 NOTE — Telephone Encounter (Signed)
Gastroenterology Pre-Procedure Review  Request Date: 03/04/2023 Requesting Physician: Dr. Vicente Males  PATIENT REVIEW QUESTIONS: The patient responded to the following health history questions as indicated:    1. Are you having any GI issues? no 2. Do you have a personal history of Polyps? no 3. Do you have a family history of Colon Cancer or Polyps? no 4. Diabetes Mellitus? no 5. Joint replacements in the past 12 months?no 6. Major health problems in the past 3 months?no 7. Any artificial heart valves, MVP, or defibrillator?no    MEDICATIONS & ALLERGIES:    Patient reports the following regarding taking any anticoagulation/antiplatelet therapy:   Plavix, Coumadin, Eliquis, Xarelto, Lovenox, Pradaxa, Brilinta, or Effient? no Aspirin? no  Patient confirms/reports the following medications:  Current Outpatient Medications  Medication Sig Dispense Refill   clobetasol (TEMOVATE) 0.05 % external solution Apply topically 2 (two) times daily.     rosuvastatin (CRESTOR) 5 MG tablet Take 1 tablet (5 mg total) by mouth at bedtime. 90 tablet 3   valACYclovir (VALTREX) 500 MG tablet PRN     No current facility-administered medications for this visit.    Patient confirms/reports the following allergies:  Allergies  Allergen Reactions   Prednisone Hives   Flagyl [Metronidazole]    Sulfa Antibiotics     No orders of the defined types were placed in this encounter.   AUTHORIZATION INFORMATION Primary Insurance: 1D#: Group #:  Secondary Insurance: 1D#: Group #:  SCHEDULE INFORMATION: Date: 03/04/2023 Time: Location: [facility]

## 2023-03-03 ENCOUNTER — Encounter: Payer: Self-pay | Admitting: Gastroenterology

## 2023-03-04 ENCOUNTER — Ambulatory Visit
Admission: RE | Admit: 2023-03-04 | Discharge: 2023-03-04 | Disposition: A | Discharge status: Home / Self Care | Payer: BC Managed Care – PPO | Attending: Gastroenterology | Admitting: Gastroenterology

## 2023-03-04 ENCOUNTER — Ambulatory Visit: Payer: BC Managed Care – PPO | Admitting: Certified Registered"

## 2023-03-04 ENCOUNTER — Encounter: Payer: Self-pay | Admitting: Gastroenterology

## 2023-03-04 ENCOUNTER — Encounter
Admission: RE | Disposition: A | Discharge status: Home / Self Care | Payer: Self-pay | Source: Home / Self Care | Attending: Gastroenterology

## 2023-03-04 DIAGNOSIS — D550 Anemia due to glucose-6-phosphate dehydrogenase [G6PD] deficiency: Secondary | ICD-10-CM | POA: Diagnosis not present

## 2023-03-04 DIAGNOSIS — E559 Vitamin D deficiency, unspecified: Secondary | ICD-10-CM | POA: Insufficient documentation

## 2023-03-04 DIAGNOSIS — I10 Essential (primary) hypertension: Secondary | ICD-10-CM | POA: Diagnosis not present

## 2023-03-04 DIAGNOSIS — F32A Depression, unspecified: Secondary | ICD-10-CM | POA: Diagnosis not present

## 2023-03-04 DIAGNOSIS — Z1211 Encounter for screening for malignant neoplasm of colon: Secondary | ICD-10-CM

## 2023-03-04 DIAGNOSIS — D120 Benign neoplasm of cecum: Secondary | ICD-10-CM | POA: Insufficient documentation

## 2023-03-04 DIAGNOSIS — D126 Benign neoplasm of colon, unspecified: Secondary | ICD-10-CM | POA: Diagnosis not present

## 2023-03-04 HISTORY — PX: COLONOSCOPY WITH PROPOFOL: SHX5780

## 2023-03-04 LAB — POCT PREGNANCY, URINE: Preg Test, Ur: NEGATIVE

## 2023-03-04 SURGERY — COLONOSCOPY WITH PROPOFOL
Anesthesia: General

## 2023-03-04 MED ORDER — LIDOCAINE HCL (PF) 2 % IJ SOLN
INTRAMUSCULAR | Status: AC
  Filled 2023-03-04: qty 5

## 2023-03-04 MED ORDER — SODIUM CHLORIDE 0.9 % IV SOLN: INTRAVENOUS | Status: DC

## 2023-03-04 MED ORDER — PROPOFOL 1000 MG/100ML IV EMUL
INTRAVENOUS | Status: AC
  Filled 2023-03-04: qty 100

## 2023-03-04 MED ORDER — LIDOCAINE HCL (PF) 2 % IJ SOLN
INTRAMUSCULAR | Status: DC | PRN
  Administered 2023-03-04: 25 mg via INTRADERMAL

## 2023-03-04 MED ORDER — PROPOFOL 500 MG/50ML IV EMUL
INTRAVENOUS | Status: DC | PRN
  Administered 2023-03-04 (×4): 50 mg via INTRAVENOUS
  Administered 2023-03-04: 20 mg via INTRAVENOUS

## 2023-03-04 NOTE — Anesthesia Postprocedure Evaluation (Signed)
Anesthesia Post Note  Patient: Mackenzie Jones  Procedure(s) Performed: COLONOSCOPY WITH PROPOFOL  Patient location during evaluation: Endoscopy Anesthesia Type: General Level of consciousness: awake and alert Pain management: pain level controlled Vital Signs Assessment: post-procedure vital signs reviewed and stable Respiratory status: spontaneous breathing, nonlabored ventilation, respiratory function stable and patient connected to nasal cannula oxygen Cardiovascular status: blood pressure returned to baseline and stable Postop Assessment: no apparent nausea or vomiting Anesthetic complications: no   No notable events documented.   Last Vitals:  Vitals:   03/04/23 0820 03/04/23 0830  BP: (!) 114/92 122/85  Pulse: 100 68  Resp: (!) 21 (!) 21  Temp:    SpO2: 100% 99%    Last Pain:  Vitals:   03/04/23 0830  TempSrc:   PainSc: 0-No pain                 Precious Haws Jackline Castilla

## 2023-03-04 NOTE — Anesthesia Preprocedure Evaluation (Signed)
Anesthesia Evaluation  Patient identified by MRN, date of birth, ID band Patient awake    Reviewed: Allergy & Precautions, NPO status , Patient's Chart, lab work & pertinent test results  History of Anesthesia Complications Negative for: history of anesthetic complications  Airway Mallampati: III  TM Distance: >3 FB Neck ROM: full    Dental  (+) Chipped   Pulmonary neg pulmonary ROS, neg shortness of breath   Pulmonary exam normal        Cardiovascular Exercise Tolerance: Good hypertension, (-) angina Normal cardiovascular exam     Neuro/Psych negative neurological ROS  negative psych ROS   GI/Hepatic negative GI ROS, Neg liver ROS,,,  Endo/Other  negative endocrine ROS    Renal/GU negative Renal ROS  negative genitourinary   Musculoskeletal   Abdominal   Peds  Hematology negative hematology ROS (+)   Anesthesia Other Findings Past Medical History: No date: Allergy 03/20/2020: Anal fissure No date: Anemia No date: Depression No date: G6PD deficiency     Comment:  Pt reported histroy from when she was in the [employer]               No records.  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 No date: Vitamin D deficiency  Past Surgical History: No date: ABLATION 2013: lipo sucttion 1996: WISDOM TOOTH EXTRACTION  BMI    Body Mass Index: 30.51 kg/m      Reproductive/Obstetrics negative OB ROS                             Anesthesia Physical Anesthesia Plan  ASA: 2  Anesthesia Plan: General   Post-op Pain Management:    Induction: Intravenous  PONV Risk Score and Plan: Propofol infusion and TIVA  Airway Management Planned: Natural Airway and Nasal Cannula  Additional Equipment:   Intra-op Plan:   Post-operative Plan:   Informed Consent: I have reviewed the patients History and Physical, chart, labs and discussed the procedure  including the risks, benefits and alternatives for the proposed anesthesia with the patient or authorized representative who has indicated his/her understanding and acceptance.     Dental Advisory Given  Plan Discussed with: Anesthesiologist, CRNA and Surgeon  Anesthesia Plan Comments: (Patient consented for risks of anesthesia including but not limited to:  - adverse reactions to medications - risk of airway placement if required - damage to eyes, teeth, lips or other oral mucosa - nerve damage due to positioning  - sore throat or hoarseness - Damage to heart, brain, nerves, lungs, other parts of body or loss of life  Patient voiced understanding.)       Anesthesia Quick Evaluation

## 2023-03-04 NOTE — Op Note (Signed)
[facility] Gastroenterology Patient Name: Mackenzie Jones Procedure Date: 03/04/2023 7:44 AM MRN: [id number] Account #: [id number] Date of Birth: [birth year] Admit Type: Outpatient Age: 52 Room: [facility] ENDO ROOM 4 Gender: Female Note Status: Finalized Instrument Name: Park Meo [id number] Procedure:             Colonoscopy Indications:           Screening for colorectal malignant neoplasm Providers:             Jonathon Bellows MD, MD Referring MD:          Myna Hidalgo. Reece Packer (Referring MD) Medicines:             Monitored Anesthesia Care Complications:         No immediate complications. Procedure:             Pre-Anesthesia Assessment:                        - Prior to the procedure, a History and Physical was                         performed, and patient medications, allergies and                         sensitivities were reviewed. The patient's tolerance                         of previous anesthesia was reviewed.                        - The risks and benefits of the procedure and the                         sedation options and risks were discussed with the                         patient. All questions were answered and informed                         consent was obtained.                        - ASA Grade Assessment: II - A patient with mild                         systemic disease.                        After obtaining informed consent, the colonoscope was                         passed under direct vision. Throughout the procedure,                         the patient's blood pressure, pulse, and oxygen                         saturations were monitored continuously. The                         Colonoscope was introduced  through the anus and                         advanced to the the cecum, identified by the                         appendiceal orifice. The colonoscopy was performed                         with ease. The patient tolerated the procedure well.                          The quality of the bowel preparation was excellent.                         The ileocecal valve, appendiceal orifice, and rectum                         were photographed. Findings:      The perianal and digital rectal examinations were normal.      Two sessile polyps were found in the cecum. The polyps were 6 to 10 mm       in size. These polyps were removed with a cold snare. Resection and       retrieval were complete.      The exam was otherwise without abnormality on direct and retroflexion       views. Impression:            - Two 6 to 10 mm polyps in the cecum, removed with a                         cold snare. Resected and retrieved.                        - The examination was otherwise normal on direct and                         retroflexion views. Recommendation:        - Discharge patient to home (with escort).                        - Resume previous diet.                        - Continue present medications.                        - Await pathology results.                        - Repeat colonoscopy for surveillance based on                         pathology results. Procedure Code(s):     --- Professional ---                        [phone], Colonoscopy, flexible; with removal of                         tumor(s), polyp(s), or  other lesion(s) by snare                         technique Diagnosis Code(s):     --- Professional ---                        D12.0, Benign neoplasm of cecum                        Z12.11, Encounter for screening for malignant neoplasm                         of colon CPT copyright 2022 American Medical Association. All rights reserved. The codes documented in this report are preliminary and upon coder review may  be revised to meet current compliance requirements. Jonathon Bellows, MD Jonathon Bellows MD, MD 03/04/2023 8:06:15 AM This report has been signed electronically. Number of Addenda: 0 Note Initiated On: 03/04/2023 7:44 AM Scope  Withdrawal Time: 0 hours 10 minutes 20 seconds  Total Procedure Duration: 0 hours 14 minutes 2 seconds  Estimated Blood Loss:  Estimated blood loss: none.      [facility]

## 2023-03-04 NOTE — Transfer of Care (Signed)
Immediate Anesthesia Transfer of Care Note  Patient: Mackenzie Jones  Procedure(s) Performed: COLONOSCOPY WITH PROPOFOL  Patient Location: PACU  Anesthesia Type:MAC  Level of Consciousness: drowsy  Airway & Oxygen Therapy: Patient Spontanous Breathing  Post-op Assessment: Report given to RN and Post -op Vital signs reviewed and stable  Post vital signs: Reviewed and stable  Last Vitals:  Vitals Value Taken Time  BP 115/70 03/04/23 0810  Temp 36.2 C 03/04/23 0810  Pulse 112 03/04/23 0811  Resp 25 03/04/23 0811  SpO2 99 % 03/04/23 0811  Vitals shown include unvalidated device data.  Last Pain:  Vitals:   03/04/23 0810  TempSrc: Temporal  PainSc: Asleep         Complications: No notable events documented.

## 2023-03-04 NOTE — H&P (Signed)
Mackenzie Bellows, MD [address] [place], [place], [place], [address] [address], [place], [place], [place], [address] Phone: [phone]  Fax: [phone]  Primary Care Physician:  Bo Merino, FNP   Pre-Procedure History & Physical: HPI:  Mackenzie Jones is a 52 y.o. female is here for an colonoscopy.   Past Medical History:  Diagnosis Date   Allergy    Anal fissure 03/20/2020   Anemia    Depression    G6PD deficiency    Pt reported histroy from when she was in the [employer] No records.    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   Vitamin D deficiency     Past Surgical History:  Procedure Laterality Date   ABLATION     lipo sucttion  2013   [place]    Prior to Admission medications   Medication Sig Start Date End Date Taking? Authorizing Provider  rosuvastatin (CRESTOR) 5 MG tablet Take 1 tablet (5 mg total) by mouth at bedtime. 08/26/22  Yes Bo Merino, FNP  clobetasol (TEMOVATE) 0.05 % external solution Apply topically 2 (two) times daily. 06/30/22   [provider]  valACYclovir (VALTREX) 500 MG tablet PRN 12/01/16   [provider]    Allergies as of 01/27/2023 - Review Complete 01/25/2023  Allergen Reaction Noted   Prednisone Hives 09/11/2021   Flagyl [metronidazole]  09/28/2012   Sulfa antibiotics  09/28/2012    Family History  Problem Relation Age of Onset   Hypertension Mother    Hyperlipidemia Mother    Protein S deficiency Father    Heart disease Maternal Grandfather    Arthritis Paternal Grandmother    Hyperlipidemia Sister    Breast cancer Neg Hx     Social History   Socioeconomic History   Marital status: Married    Spouse name: brian   Number of children: 2   Years of education: Masters   Highest education level: Not on file  Occupational History   Occupation: Chief Financial Officer  Tobacco Use   Smoking status: Never   Smokeless tobacco: Never  Vaping Use   Vaping Use: Never  used  Substance and Sexual Activity   Alcohol use: No   Drug use: No   Sexual activity: Yes    Partners: Male    Birth control/protection: Surgical    Comment: vas  Other Topics Concern   Not on file  Social History Narrative   Married to [place]. They have 2 daughters Junie Panning and [place]).   Master's in Engineer, production. Works full time.   Drinks caffeine, takes a daily vitamin.    Exercises routinely.   Smoke detector in the home.   Firearms locked in the home.    Feels safe in her relationships.    Works at [employer]: Low Risk  (07/24/2022)   Overall Financial Resource Strain (CARDIA)    Difficulty of Paying Living Expenses: Not hard at all  Food Insecurity: No Food Insecurity (07/24/2022)   Hunger Vital Sign    Worried About Running Out of Food in the Last Year: Never true    Ran Out of Food in the Last Year: Never true  Transportation Needs: No Transportation Needs (07/24/2022)   PRAPARE - Hydrologist (Medical): No    Lack of Transportation (Non-Medical): No  Physical Activity: Insufficiently Active (07/24/2022)   Exercise Vital Sign  Days of Exercise per Week: 2 days    Minutes of Exercise per Session: 20 min  Stress: No Stress Concern Present (07/24/2022)   [place]    Feeling of Stress : Not at all  Social Connections: [place] (07/24/2022)   Social Connection and Isolation Panel [NHANES]    Frequency of Communication with Friends and Family: More than three times a week    Frequency of Social Gatherings with Friends and Family: More than three times a week    Attends Religious Services: 1 to 4 times per year    Active Member of [employer] or Organizations: Yes    Attends Archivist Meetings: 1 to 4 times per year    Marital Status: Married  Human resources officer Violence: Not At Risk (07/24/2022)    Humiliation, Afraid, Rape, and Kick questionnaire    Fear of Current or Ex-Partner: No    Emotionally Abused: No    Physically Abused: No    Sexually Abused: No    Review of Systems: See HPI, otherwise negative ROS  Physical Exam: BP (!) 141/92   Pulse 97   Temp (!) 96.4 F (35.8 C) (Temporal)   Resp 16   Wt 70.9 kg   SpO2 98%   BMI 30.51 kg/m  General:   Alert,  pleasant and cooperative in NAD Head:  Normocephalic and atraumatic. Neck:  Supple; no masses or thyromegaly. Lungs:  Clear throughout to auscultation, normal respiratory effort.    Heart:  +S1, +S2, Regular rate and rhythm, No edema. Abdomen:  Soft, nontender and nondistended. Normal bowel sounds, without guarding, and without rebound.   Neurologic:  Alert and  oriented x4;  grossly normal neurologically.  Impression/Plan: Mackenzie Jones is here for an colonoscopy to be performed for Screening colonoscopy average risk   Risks, benefits, limitations, and alternatives regarding  colonoscopy have been reviewed with the patient.  Questions have been answered.  All parties agreeable.   Mackenzie Bellows, MD  03/04/2023, 7:42 AM

## 2023-03-05 ENCOUNTER — Encounter: Payer: Self-pay | Admitting: Gastroenterology

## 2023-03-05 LAB — SURGICAL PATHOLOGY

## 2023-03-12 ENCOUNTER — Encounter: Payer: Self-pay | Admitting: Gastroenterology

## 2023-03-18 ENCOUNTER — Encounter: Payer: Self-pay | Admitting: Nurse Practitioner

## 2023-03-22 NOTE — Progress Notes (Unsigned)
   There were no vitals taken for this visit.   Subjective:    Patient ID: Mackenzie Jones, female    DOB: February 03, 1971, 52 y.o.   MRN: [id number]  HPI: Mackenzie Jones is a 52 y.o. female  No chief complaint on file.  Hot flashes:  Relevant past medical, surgical, family and social history reviewed and updated as indicated. Interim medical history since our last visit reviewed. Allergies and medications reviewed and updated.  Review of Systems  Constitutional: Negative for fever or weight change.  Respiratory: Negative for cough and shortness of breath.   Cardiovascular: Negative for chest pain or palpitations.  Gastrointestinal: Negative for abdominal pain, no bowel changes.  Musculoskeletal: Negative for gait problem or joint swelling.  Skin: Negative for rash.  Neurological: Negative for dizziness or headache.  No other specific complaints in a complete review of systems (except as listed in HPI above).      Objective:    There were no vitals taken for this visit.  Wt Readings from Last 3 Encounters:  03/04/23 156 lb 3.2 oz (70.9 kg)  01/25/23 152 lb 9.6 oz (69.2 kg)  08/26/22 188 lb 6.4 oz (85.5 kg)    Physical Exam  Constitutional: Patient appears well-developed and well-nourished. Obese *** No distress.  HEENT: head atraumatic, normocephalic, pupils equal and reactive to light, ears ***, neck supple, throat within normal limits Cardiovascular: Normal rate, regular rhythm and normal heart sounds.  No murmur heard. No BLE edema. Pulmonary/Chest: Effort normal and breath sounds normal. No respiratory distress. Abdominal: Soft.  There is no tenderness. Psychiatric: Patient has a normal mood and affect. behavior is normal. Judgment and thought content normal.       Assessment & Plan:   Problem List Items Addressed This Visit   None    Follow up plan: No follow-ups on file.

## 2023-03-24 ENCOUNTER — Other Ambulatory Visit: Payer: Self-pay

## 2023-03-24 ENCOUNTER — Ambulatory Visit (INDEPENDENT_AMBULATORY_CARE_PROVIDER_SITE_OTHER): Payer: BC Managed Care – PPO | Admitting: Nurse Practitioner

## 2023-03-24 ENCOUNTER — Encounter: Payer: Self-pay | Admitting: Nurse Practitioner

## 2023-03-24 VITALS — BP 128/74 | HR 92 | Temp 97.9°F | Resp 16 | Ht 60.0 in | Wt 150.0 lb

## 2023-03-24 DIAGNOSIS — R232 Flushing: Secondary | ICD-10-CM | POA: Diagnosis not present

## 2023-03-24 MED ORDER — VENLAFAXINE HCL ER 37.5 MG PO CP24
37.5000 mg | ORAL_CAPSULE | Freq: Every day | ORAL | 0 refills | Status: DC
Start: 2023-03-24 — End: 2023-04-22

## 2023-04-07 DIAGNOSIS — Z79890 Hormone replacement therapy (postmenopausal): Secondary | ICD-10-CM | POA: Diagnosis not present

## 2023-04-07 DIAGNOSIS — N951 Menopausal and female climacteric states: Secondary | ICD-10-CM | POA: Diagnosis not present

## 2023-04-17 ENCOUNTER — Other Ambulatory Visit: Payer: Self-pay | Admitting: Nurse Practitioner

## 2023-04-17 DIAGNOSIS — R232 Flushing: Secondary | ICD-10-CM

## 2023-04-19 NOTE — Telephone Encounter (Signed)
Requested medication (s) are due for refill today:   Yes  Requested medication (s) are on the active medication list:   Yes  Future visit scheduled:   Yes   Last ordered: 03/24/2023 #30, 0 refills  Returned because a 90 day supply and a DX Code are being requested   Requested Prescriptions  Pending Prescriptions Disp Refills   venlafaxine XR (EFFEXOR-XR) 37.5 MG 24 hr capsule [Pharmacy Med Name: VENLAFAXINE HCL ER 37.5 MG CAP] 90 capsule 1    Sig: Take 1 capsule (37.5 mg total) by mouth daily at 6 (six) AM.     Psychiatry: Antidepressants - SNRI - desvenlafaxine & venlafaxine Failed - 04/17/2023  2:32 PM      Failed - Lipid Panel in normal range within the last 12 months    Cholesterol, Total  Date Value Ref Range Status  02/02/2017 256 (H) 100 - 199 mg/dL Final   Cholesterol  Date Value Ref Range Status  01/25/2023 223 (H) <200 mg/dL Final   LDL Cholesterol (Calc)  Date Value Ref Range Status  01/25/2023 155 (H) mg/dL (calc) Final    Comment:    Reference range: <100 . Desirable range <100 mg/dL for primary prevention;   <70 mg/dL for patients with CHD or diabetic patients  with > or = 2 CHD risk factors. Marland Kitchen LDL-C is now calculated using the Martin-Hopkins  calculation, which is a validated novel method providing  better accuracy than the Friedewald equation in the  estimation of LDL-C.  Horald Pollen et al. Lenox Ahr. 3086;578(46): 2061-2068  (http://education.[facility].com/faq/FAQ164)    HDL  Date Value Ref Range Status  01/25/2023 46 (L) > OR = 50 mg/dL Final  96/29/5284 48 >13 mg/dL Final   Triglycerides  Date Value Ref Range Status  01/25/2023 105 <150 mg/dL Final         Passed - Cr in normal range and within 360 days    Creat  Date Value Ref Range Status  01/25/2023 0.92 0.50 - 1.03 mg/dL Final         Passed - Last BP in normal range    BP Readings from Last 1 Encounters:  03/24/23 128/74         Passed - Valid encounter within last 6 months     Recent Outpatient Visits           3 weeks ago Hot flashes   [facility] Berniece Salines, FNP   2 months ago Mixed hyperlipidemia   [facility] Berniece Salines, FNP   7 months ago Vaginal discharge   [facility] Berniece Salines, FNP   8 months ago Annual physical exam   [facility] Berniece Salines, FNP   10 months ago Mixed hyperlipidemia   [facility] Berniece Salines, FNP       Future Appointments             In 3 months Zane Herald, Rudolpho Sevin, FNP [facility], [facility]

## 2023-05-05 DIAGNOSIS — N951 Menopausal and female climacteric states: Secondary | ICD-10-CM | POA: Diagnosis not present

## 2023-05-05 DIAGNOSIS — Z79890 Hormone replacement therapy (postmenopausal): Secondary | ICD-10-CM | POA: Diagnosis not present

## 2023-07-26 NOTE — Progress Notes (Unsigned)
Name: Mackenzie Jones   MRN: [id number]    DOB: [birth year]   Date:07/27/2023       Progress Note  Subjective  Chief Complaint  Chief Complaint  Patient presents with   Annual Exam    HPI  Patient presents for annual CPE.  Diet: Regular, well balanced diet Exercise: 2 times a week 30 minutes , recommend 150 min of physical activity weekly   Last Eye Exam: September 2023 Last Dental Exam: February 2024  Flowsheet Row Office Visit from 07/27/2023 in [facility]  AUDIT-C Score 0      Depression: Phq 9 is  negative    07/27/2023    7:50 AM 03/24/2023    8:37 AM 01/25/2023    8:54 AM 08/26/2022    3:34 PM 07/24/2022   10:38 AM  Depression screen PHQ 2/9  Decreased Interest 0 0 0 0 0  Down, Depressed, Hopeless 0 0 0 0 0  PHQ - 2 Score 0 0 0 0 0  Altered sleeping    0   Tired, decreased energy    0   Change in appetite    0   Feeling bad or failure about yourself     0   Trouble concentrating    0   Moving slowly or fidgety/restless    0   Suicidal thoughts    0   PHQ-9 Score    0   Difficult doing work/chores    Not difficult at all    Hypertension: BP Readings from Last 3 Encounters:  07/27/23 132/80  03/24/23 128/74  03/04/23 122/85   Obesity: Wt Readings from Last 3 Encounters:  07/27/23 164 lb (74.4 kg)  03/24/23 150 lb (68 kg)  03/04/23 156 lb 3.2 oz (70.9 kg)   BMI Readings from Last 3 Encounters:  07/27/23 32.03 kg/m  03/24/23 29.29 kg/m  03/04/23 30.51 kg/m     Vaccines:  HPV: up to at age 42 , ask insurance if age between 72-45  Shingrix: 10-64 yo and ask insurance if covered when patient above 59 yo Pneumonia:  educated and discussed with patient. Flu:  educated and discussed with patient.     Hep C Screening: completed STD testing and prevention (HIV/chl/gon/syphilis): completed Intimate partner violence: negative screen  Sexual History : yes Menstrual History/LMP/Abnormal Bleeding: ablation Discussed importance of  follow up if any post-menopausal bleeding: yes  Incontinence Symptoms: negative for symptoms   Breast cancer:  - Last Mammogram: February 22,2024 - BRCA gene screening: none  Osteoporosis Prevention : Discussed high calcium and vitamin D supplementation, weight bearing exercises Bone density :no   Cervical cancer screening: 01/16/2022  Skin cancer: Discussed monitoring for atypical lesions  Colorectal cancer: March 04, 2023   Lung cancer:  Low Dose CT Chest recommended if Age 57-80 years, 20 pack-year currently smoking OR have quit w/in 15years. Patient does not qualify for screen   ECG: 02/02/2017  Advanced Care Planning: A voluntary discussion about advance care planning including the explanation and discussion of advance directives.  Discussed health care proxy and Living will, and the patient was able to identify a health care proxy as husband.  Patient does have a living will and power of attorney of health care   Lipids: Lab Results  Component Value Date   CHOL 223 (H) 01/25/2023   CHOL 188 07/24/2022   CHOL 177 11/17/2019   Lab Results  Component Value Date   HDL 46 (L) 01/25/2023   HDL 53  07/24/2022   HDL 47.90 11/17/2019   Lab Results  Component Value Date   LDLCALC 155 (H) 01/25/2023   LDLCALC 114 (H) 07/24/2022   LDLCALC 111 (H) 11/17/2019   Lab Results  Component Value Date   TRIG 105 01/25/2023   TRIG 103 07/24/2022   TRIG 87.0 11/17/2019   Lab Results  Component Value Date   CHOLHDL 4.8 01/25/2023   CHOLHDL 3.5 07/24/2022   CHOLHDL 4 11/17/2019   No results found for: "LDLDIRECT"  Glucose: Glucose, Bld  Date Value Ref Range Status  01/25/2023 86 65 - 99 mg/dL Final    Comment:    .            Fasting reference interval .   07/24/2022 90 65 - 99 mg/dL Final    Comment:    .            Fasting reference interval .   11/17/2019 97 70 - 99 mg/dL Final    Patient Active Problem List   Diagnosis Date Noted   Adenomatous polyp of colon  03/04/2023   Overweight 01/25/2023   Anal fissure 07/24/2022   Anorectal disorder 07/24/2022   Bleeding per rectum 07/24/2022   Flatulence, eructation and gas pain 07/24/2022   Hemorrhoids 07/24/2022   Screening for colon cancer 07/24/2022   Essential hypertension 11/17/2019   Mixed hyperlipidemia 11/17/2019   Fibroma 01/19/2019   Family history of protein S deficiency 10/19/2017   Vitamin D deficiency 01/18/2017   History of PCR DNA positive for HSV2    Right arm pain 10/01/2015    Past Surgical History:  Procedure Laterality Date   ABLATION     COLONOSCOPY WITH PROPOFOL N/A 03/04/2023   Procedure: COLONOSCOPY WITH PROPOFOL;  Surgeon: Wyline Mood, MD;  Location: [facility] ENDOSCOPY;  Service: Gastroenterology;  Laterality: N/A;   lipo sucttion  2013   WISDOM TOOTH EXTRACTION  1996    Family History  Problem Relation Age of Onset   Hypertension Mother    Hyperlipidemia Mother    Protein S deficiency Father    Heart disease Maternal Grandfather    Arthritis Paternal Grandmother    Hyperlipidemia Sister    Breast cancer Neg Hx     Social History   Socioeconomic History   Marital status: Married    Spouse name: brian   Number of children: 2   Years of education: Masters   Highest education level: Not on file  Occupational History   Occupation: Art gallery manager  Tobacco Use   Smoking status: Never   Smokeless tobacco: Never  Vaping Use   Vaping status: Never Used  Substance and Sexual Activity   Alcohol use: No   Drug use: No   Sexual activity: Yes    Partners: Male    Birth control/protection: Surgical    Comment: vas  Other Topics Concern   Not on file  Social History Narrative   Married to [place]. They have 2 daughters Denny [place]on and [place]).   Master's in Public relations account executive. Works full time.   Drinks caffeine, takes a daily vitamin.    Exercises routinely.   Smoke detector in the home.   Firearms locked in the home.    Feels safe in her relationships.    Works at [employer]: Low Risk  (07/27/2023)   Overall Financial Resource Strain (CARDIA)    Difficulty of Paying Living Expenses: Not hard at all  Food Insecurity: No Food  Insecurity (07/27/2023)   Hunger Vital Sign    Worried About Running Out of Food in the Last Year: Never true    Ran Out of Food in the Last Year: Never true  Transportation Needs: No Transportation Needs (07/27/2023)   PRAPARE - Administrator, Civil Service (Medical): No    Lack of Transportation (Non-Medical): No  Physical Activity: Insufficiently Active (07/27/2023)   Exercise Vital Sign    Days of Exercise per Week: 2 days    Minutes of Exercise per Session: 30 min  Stress: No Stress Concern Present (07/27/2023)   [employer] of Occupational Health - Occupational Stress Questionnaire    Feeling of Stress : Not at all  Social Connections: Socially Integrated (07/27/2023)   Social Connection and Isolation Panel [NHANES]    Frequency of Communication with Friends and Family: More than three times a week    Frequency of Social Gatherings with Friends and Family: More than three times a week    Attends Religious Services: More than 4 times per year    Active Member of [employer] or Organizations: No    Attends Engineer, structural: More than 4 times per year    Marital Status: Married  Catering manager Violence: Not At Risk (07/27/2023)   Humiliation, Afraid, Rape, and Kick questionnaire    Fear of Current or Ex-Partner: No    Emotionally Abused: No    Physically Abused: No    Sexually Abused: No     Current Outpatient Medications:    clobetasol (TEMOVATE) 0.05 % external solution, Apply topically 2 (two) times daily., Disp: , Rfl:    estradiol (CLIMARA - DOSED IN MG/24 HR) 0.075 mg/24hr patch, Place 0.075 mg onto the skin once a week., Disp: , Rfl:    progesterone (PROMETRIUM) 100 MG capsule, Take 1 tablet by mouth daily., Disp: , Rfl:    rosuvastatin  (CRESTOR) 5 MG tablet, Take 1 tablet (5 mg total) by mouth at bedtime., Disp: 90 tablet, Rfl: 3   valACYclovir (VALTREX) 500 MG tablet, PRN, Disp: , Rfl:    venlafaxine XR (EFFEXOR-XR) 37.5 MG 24 hr capsule, TAKE 1 CAPSULE (37.5 MG TOTAL) BY MOUTH DAILY AT 6 (SIX) AM. (Patient not taking: Reported on 07/27/2023), Disp: 90 capsule, Rfl: 1  Allergies  Allergen Reactions   Prednisone Hives   Flagyl [Metronidazole]    Sulfa Antibiotics      ROS  Constitutional: Negative for fever or weight change.  Respiratory: Negative for cough and shortness of breath.   Cardiovascular: Negative for chest pain or palpitations.  Gastrointestinal: Negative for abdominal pain, no bowel changes.  Musculoskeletal: Negative for gait problem or joint swelling.  Skin: Negative for rash.  Neurological: Negative for dizziness or headache.  No other specific complaints in a complete review of systems (except as listed in HPI above).   Objective  Vitals:   07/27/23 0743  BP: 132/80  Pulse: 99  Resp: 16  Temp: 98.1 F (36.7 C)  TempSrc: Oral  SpO2: 98%  Weight: 164 lb (74.4 kg)  Height: 5' (1.524 m)    Body mass index is 32.03 kg/m.  Physical Exam Constitutional: Patient appears well-developed and well-nourished. No distress.  HENT: Head: Normocephalic and atraumatic. Ears: B TMs ok, no erythema or effusion; Nose: Nose normal. Mouth/Throat: Oropharynx is clear and moist. No oropharyngeal exudate.  Eyes: Conjunctivae and EOM are normal. Pupils are equal, round, and reactive to light. No scleral icterus.  Neck: Normal range of motion. Neck  supple. No JVD present. No thyromegaly present.  Cardiovascular: Normal rate, regular rhythm and normal heart sounds.  No murmur heard. No BLE edema. Pulmonary/Chest: Effort normal and breath sounds normal. No respiratory distress. Abdominal: Soft. Bowel sounds are normal, no distension. There is no tenderness. no masses Breast: no lumps or masses, no nipple  discharge or rashes Musculoskeletal: Normal range of motion, no joint effusions. No gross deformities Neurological: he is alert and oriented to person, place, and time. No cranial nerve deficit. Coordination, balance, strength, speech and gait are normal.  Skin: Skin is warm and dry. No rash noted. No erythema.  Psychiatric: Patient has a normal mood and affect. behavior is normal. Judgment and thought content normal.   No results found for this or any previous visit (from the past 2160 hour(s)).   Fall Risk:    07/27/2023    7:49 AM 03/24/2023    8:37 AM 01/25/2023    8:53 AM 08/26/2022    3:34 PM 07/24/2022   10:37 AM  Fall Risk   Falls in the past year? 0 0 0 0 0  Number falls in past yr: 0 0 0 0 0  Injury with Fall? 0 0 0 0 0  Follow up     Falls evaluation completed     Functional Status Survey: Is the patient deaf or have difficulty hearing?: No Does the patient have difficulty seeing, even when wearing glasses/contacts?: No Does the patient have difficulty concentrating, remembering, or making decisions?: No Does the patient have difficulty walking or climbing stairs?: No Does the patient have difficulty dressing or bathing?: No Does the patient have difficulty doing errands alone such as visiting a doctor's office or shopping?: No   Assessment & Plan  1. Annual physical exam  - CBC with Differential/Platelet - COMPLETE METABOLIC PANEL WITH GFR - Lipid panel - Hemoglobin A1c  2. Screening for diabetes mellitus  - COMPLETE METABOLIC PANEL WITH GFR - Hemoglobin A1c  3. Screening for deficiency anemia  - CBC with Differential/Platelet  4. Screening for cholesterol level  - Lipid panel  5. Mixed hyperlipidemia   - Lipid panel 6. Patient request for diagnostic testing  - Estrogens, total - Testosterone - FSH/LH - Prolactin - TSH  7. Hot flashes  - Estrogens, total - Testosterone - FSH/LH - Prolactin - TSH   -USPSTF grade A and B recommendations  reviewed with patient; age-appropriate recommendations, preventive care, screening tests, etc discussed and encouraged; healthy living encouraged; see AVS for patient education given to patient -Discussed importance of 150 minutes of physical activity weekly, eat two servings of fish weekly, eat one serving of tree nuts ( cashews, pistachios, pecans, almonds.Marland Kitchen) every other day, eat 6 servings of fruit/vegetables daily and drink plenty of water and avoid sweet beverages.   -Reviewed Health Maintenance: Yes.

## 2023-07-27 ENCOUNTER — Ambulatory Visit (INDEPENDENT_AMBULATORY_CARE_PROVIDER_SITE_OTHER): Payer: BC Managed Care – PPO | Admitting: Nurse Practitioner

## 2023-07-27 ENCOUNTER — Other Ambulatory Visit: Payer: Self-pay

## 2023-07-27 ENCOUNTER — Encounter: Payer: Self-pay | Admitting: Nurse Practitioner

## 2023-07-27 VITALS — BP 132/80 | HR 99 | Temp 98.1°F | Resp 16 | Ht 60.0 in | Wt 164.0 lb

## 2023-07-27 DIAGNOSIS — Z0001 Encounter for general adult medical examination with abnormal findings: Secondary | ICD-10-CM

## 2023-07-27 DIAGNOSIS — R232 Flushing: Secondary | ICD-10-CM | POA: Diagnosis not present

## 2023-07-27 DIAGNOSIS — N959 Unspecified menopausal and perimenopausal disorder: Secondary | ICD-10-CM | POA: Diagnosis not present

## 2023-07-27 DIAGNOSIS — Z13220 Encounter for screening for lipoid disorders: Secondary | ICD-10-CM | POA: Diagnosis not present

## 2023-07-27 DIAGNOSIS — Z0189 Encounter for other specified special examinations: Secondary | ICD-10-CM

## 2023-07-27 DIAGNOSIS — Z130 Encounter for screening for diseases of the blood and blood-forming organs and certain disorders involving the immune mechanism: Secondary | ICD-10-CM

## 2023-07-27 DIAGNOSIS — E782 Mixed hyperlipidemia: Secondary | ICD-10-CM | POA: Diagnosis not present

## 2023-07-27 DIAGNOSIS — Z0000 Encounter for general adult medical examination without abnormal findings: Secondary | ICD-10-CM

## 2023-07-27 DIAGNOSIS — Z131 Encounter for screening for diabetes mellitus: Secondary | ICD-10-CM

## 2023-07-29 ENCOUNTER — Encounter: Payer: Self-pay | Admitting: Nurse Practitioner

## 2023-07-31 LAB — LIPID PANEL
Cholesterol: 191 mg/dL (ref <200)
HDL: 60 mg/dL (ref >=50)
LDL Cholesterol (Calc): 108 mg/dL — ABNORMAL HIGH
Non-HDL Cholesterol (Calc): 131 mg/dL — ABNORMAL HIGH (ref <130)
Total CHOL/HDL Ratio: 3.2 (calc) (ref <5.0)
Triglycerides: 118 mg/dL (ref <150)

## 2023-07-31 LAB — COMPLETE METABOLIC PANEL WITH GFR
AG Ratio: 1.5 (calc) (ref 1.0–2.5)
ALT: 40 U/L — ABNORMAL HIGH (ref 6–29)
AST: 24 U/L (ref 10–35)
Albumin: 4.1 g/dL (ref 3.6–5.1)
Alkaline phosphatase (APISO): 102 U/L (ref 37–153)
BUN: 14 mg/dL (ref 7–25)
CO2: 26 mmol/L (ref 20–32)
Calcium: 9.4 mg/dL (ref 8.6–10.4)
Chloride: 108 mmol/L (ref 98–110)
Creat: 0.77 mg/dL (ref 0.50–1.03)
Globulin: 2.7 g/dL (ref 1.9–3.7)
Glucose, Bld: 106 mg/dL — ABNORMAL HIGH (ref 65–99)
Potassium: 4.3 mmol/L (ref 3.5–5.3)
Sodium: 142 mmol/L (ref 135–146)
Total Bilirubin: 0.3 mg/dL (ref 0.2–1.2)
Total Protein: 6.8 g/dL (ref 6.1–8.1)
eGFR: 92 mL/min/{1.73_m2} (ref >=60)

## 2023-07-31 LAB — HEMOGLOBIN A1C
Hgb A1c MFr Bld: 5.2 %{Hb} (ref <5.7)
Mean Plasma Glucose: 103 mg/dL
eAG (mmol/L): 5.7 mmol/L

## 2023-07-31 LAB — CBC WITH DIFFERENTIAL/PLATELET
Absolute Monocytes: 329 {cells}/uL (ref 200–950)
Basophils Absolute: 42 {cells}/uL (ref 0–200)
Basophils Relative: 0.6 %
Eosinophils Absolute: 63 cells/uL (ref 15–500)
Eosinophils Relative: 0.9 %
HCT: 33.6 % — ABNORMAL LOW (ref 35.0–45.0)
Hemoglobin: 11 g/dL — ABNORMAL LOW (ref 11.7–15.5)
Lymphs Abs: 3073 {cells}/uL (ref 850–3900)
MCH: 29.3 pg (ref 27.0–33.0)
MCHC: 32.7 g/dL (ref 32.0–36.0)
MCV: 89.4 fL (ref 80.0–100.0)
MPV: 9.9 fL (ref 7.5–12.5)
Monocytes Relative: 4.7 %
Neutro Abs: 3493 {cells}/uL (ref 1500–7800)
Neutrophils Relative %: 49.9 %
Platelets: 389 10*3/uL (ref 140–400)
RBC: 3.76 10*6/uL — ABNORMAL LOW (ref 3.80–5.10)
RDW: 12.9 % (ref 11.0–15.0)
Total Lymphocyte: 43.9 %
WBC: 7 10*3/uL (ref 3.8–10.8)

## 2023-07-31 LAB — TESTOSTERONE, TOTAL, LC/MS/MS: Testosterone, Total, LC-MS-MS: 6 ng/dL (ref 2–45)

## 2023-07-31 LAB — PROLACTIN: Prolactin: 10.3 ng/mL

## 2023-07-31 LAB — TSH: TSH: 3.75 m[IU]/L

## 2023-07-31 LAB — FSH/LH
FSH: 59.5 m[IU]/mL
LH: 42.1 m[IU]/mL

## 2023-07-31 LAB — ESTROGENS, TOTAL: Estrogen: 79 pg/mL

## 2023-10-27 ENCOUNTER — Ambulatory Visit: Payer: BC Managed Care – PPO | Admitting: Nurse Practitioner

## 2023-10-27 NOTE — Progress Notes (Deleted)
There were no vitals taken for this visit.   Subjective:    Patient ID: Mackenzie Jones, female    DOB: 12/09/1970, 52 y.o.   MRN: [id number]  HPI: Mackenzie Jones is a 52 y.o. female  No chief complaint on file.   Relevant past medical, surgical, family and social history reviewed and updated as indicated. Interim medical history since our last visit reviewed. Allergies and medications reviewed and updated.  Review of Systems  Ten systems reviewed and is negative except as mentioned in HPI ***      Objective:    There were no vitals taken for this visit.  Wt Readings from Last 3 Encounters:  07/27/23 164 lb (74.4 kg)  03/24/23 150 lb (68 kg)  03/04/23 156 lb 3.2 oz (70.9 kg)    Physical Exam  Constitutional: Patient appears well-developed and well-nourished. Obese *** No distress.  HEENT: head atraumatic, normocephalic, pupils equal and reactive to light, ears ***, neck supple, throat within normal limits Cardiovascular: Normal rate, regular rhythm and normal heart sounds.  No murmur heard. No BLE edema. Pulmonary/Chest: Effort normal and breath sounds normal. No respiratory distress. Abdominal: Soft.  There is no tenderness. Psychiatric: Patient has a normal mood and affect. behavior is normal. Judgment and thought content normal.  Results for orders placed or performed in visit on 07/27/23  Estrogens, total  Result Value Ref Range   Estrogen 79 pg/mL  FSH/LH  Result Value Ref Range   FSH 59.5 mIU/mL   LH 42.1 mIU/mL  Prolactin  Result Value Ref Range   Prolactin 10.3 ng/mL  TSH  Result Value Ref Range   TSH 3.75 mIU/L  CBC with Differential/Platelet  Result Value Ref Range   WBC 7.0 3.8 - 10.8 Thousand/uL   RBC 3.76 (L) 3.80 - 5.10 Million/uL   Hemoglobin 11.0 (L) 11.7 - 15.5 g/dL   HCT 40.9 (L) 81.1 - 91.4 %   MCV 89.4 80.0 - 100.0 fL   MCH 29.3 27.0 - 33.0 pg   MCHC 32.7 32.0 - 36.0 g/dL   RDW 78.2 95.6 - 21.3 %   Platelets 389 140 - 400 Thousand/uL   MPV  9.9 7.5 - 12.5 fL   Neutro Abs 3,493 1,500 - 7,800 cells/uL   Lymphs Abs 3,073 850 - 3,900 cells/uL   Absolute Monocytes 329 200 - 950 cells/uL   Eosinophils Absolute 63 15 - 500 cells/uL   Basophils Absolute 42 0 - 200 cells/uL   Neutrophils Relative % 49.9 %   Total Lymphocyte 43.9 %   Monocytes Relative 4.7 %   Eosinophils Relative 0.9 %   Basophils Relative 0.6 %  COMPLETE METABOLIC PANEL WITH GFR  Result Value Ref Range   Glucose, Bld 106 (H) 65 - 99 mg/dL   BUN 14 7 - 25 mg/dL   Creat 0.86 5.78 - 4.69 mg/dL   eGFR 92 > OR = 60 GE/XBM/8.41L2   BUN/Creatinine Ratio SEE NOTE: 6 - 22 (calc)   Sodium 142 135 - 146 mmol/L   Potassium 4.3 3.5 - 5.3 mmol/L   Chloride 108 98 - 110 mmol/L   CO2 26 20 - 32 mmol/L   Calcium 9.4 8.6 - 10.4 mg/dL   Total Protein 6.8 6.1 - 8.1 g/dL   Albumin 4.1 3.6 - 5.1 g/dL   Globulin 2.7 1.9 - 3.7 g/dL (calc)   AG Ratio 1.5 1.0 - 2.5 (calc)   Total Bilirubin 0.3 0.2 - 1.2 mg/dL   Alkaline phosphatase (APISO) 102  37 - 153 U/L   AST 24 10 - 35 U/L   ALT 40 (H) 6 - 29 U/L  Lipid panel  Result Value Ref Range   Cholesterol 191 <200 mg/dL   HDL 60 > OR = 50 mg/dL   Triglycerides 086 <578 mg/dL   LDL Cholesterol (Calc) 108 (H) mg/dL (calc)   Total CHOL/HDL Ratio 3.2 <5.0 (calc)   Non-HDL Cholesterol (Calc) 131 (H) <130 mg/dL (calc)  Hemoglobin I6N  Result Value Ref Range   Hgb A1c MFr Bld 5.2 <5.7 % of total Hgb   Mean Plasma Glucose 103 mg/dL   eAG (mmol/L) 5.7 mmol/L  Testosterone, Total, LC/MS/MS  Result Value Ref Range   Testosterone, Total, LC-MS-MS 6 2 - 45 ng/dL      Assessment & Plan:   Problem List Items Addressed This Visit   None    Follow up plan: No follow-ups on file.

## 2023-11-18 ENCOUNTER — Other Ambulatory Visit: Payer: Self-pay | Admitting: Nurse Practitioner

## 2023-11-18 DIAGNOSIS — E782 Mixed hyperlipidemia: Secondary | ICD-10-CM

## 2023-11-18 NOTE — Telephone Encounter (Signed)
Requested Prescriptions  Pending Prescriptions Disp Refills   rosuvastatin (CRESTOR) 5 MG tablet [Pharmacy Med Name: ROSUVASTATIN CALCIUM 5 MG TAB] 90 tablet 1    Sig: TAKE 1 TABLET BY MOUTH EVERYDAY AT BEDTIME     Cardiovascular:  Antilipid - Statins 2 Failed - 11/18/2023 10:21 AM      Failed - Lipid Panel in normal range within the last 12 months    Cholesterol, Total  Date Value Ref Range Status  02/02/2017 256 (H) 100 - 199 mg/dL Final   Cholesterol  Date Value Ref Range Status  07/27/2023 191 <200 mg/dL Final   LDL Cholesterol (Calc)  Date Value Ref Range Status  07/27/2023 108 (H) mg/dL (calc) Final    Comment:    Reference range: <100 . Desirable range <100 mg/dL for primary prevention;   <70 mg/dL for patients with CHD or diabetic patients  with > or = 2 CHD risk factors. Marland Kitchen LDL-C is now calculated using the Martin-Hopkins  calculation, which is a validated novel method providing  better accuracy than the Friedewald equation in the  estimation of LDL-C.  Horald Pollen et al. Lenox Ahr. 1914;782(95): 2061-2068  (http://education.[facility].com/faq/FAQ164)    HDL  Date Value Ref Range Status  07/27/2023 60 > OR = 50 mg/dL Final  62/13/0865 48 >78 mg/dL Final   Triglycerides  Date Value Ref Range Status  07/27/2023 118 <150 mg/dL Final         Passed - Cr in normal range and within 360 days    Creat  Date Value Ref Range Status  07/27/2023 0.77 0.50 - 1.03 mg/dL Final         Passed - Patient is not pregnant      Passed - Valid encounter within last 12 months    Recent Outpatient Visits           3 months ago Annual physical exam   [facility] Berniece Salines, FNP   7 months ago Hot flashes   [facility] Berniece Salines, FNP   9 months ago Mixed hyperlipidemia   [facility] Berniece Salines, FNP   1 year ago Vaginal discharge   [facility] Berniece Salines, FNP   1 year ago Annual physical exam   [facility] Berniece Salines, FNP       Future Appointments             In 8 months Zane Herald, Rudolpho Sevin, FNP [facility]

## 2023-12-06 DIAGNOSIS — Z6830 Body mass index (BMI) 30.0-30.9, adult: Secondary | ICD-10-CM | POA: Diagnosis not present

## 2023-12-06 DIAGNOSIS — Z4802 Encounter for removal of sutures: Secondary | ICD-10-CM | POA: Diagnosis not present

## 2023-12-23 ENCOUNTER — Encounter: Payer: BC Managed Care – PPO | Admitting: Obstetrics

## 2023-12-30 ENCOUNTER — Other Ambulatory Visit: Payer: Self-pay | Admitting: Obstetrics and Gynecology

## 2023-12-30 DIAGNOSIS — Z1231 Encounter for screening mammogram for malignant neoplasm of breast: Secondary | ICD-10-CM

## 2024-01-05 ENCOUNTER — Encounter: Payer: Self-pay | Admitting: Nurse Practitioner

## 2024-01-06 ENCOUNTER — Ambulatory Visit: Payer: BC Managed Care – PPO | Admitting: Nurse Practitioner

## 2024-01-06 ENCOUNTER — Other Ambulatory Visit: Payer: Self-pay | Admitting: Nurse Practitioner

## 2024-01-06 DIAGNOSIS — E782 Mixed hyperlipidemia: Secondary | ICD-10-CM | POA: Diagnosis not present

## 2024-01-06 NOTE — Progress Notes (Deleted)
 There were no vitals taken for this visit.   Subjective:    Patient ID: Mackenzie Jones, female    DOB: [birth year], 53 y.o.   MRN: [id number]  HPI: Mackenzie Jones is a 53 y.o. female  No chief complaint on file.   Discussed the use of AI scribe software for clinical note transcription with the patient, who gave verbal consent to proceed.  History of Present Illness           07/27/2023    7:50 AM 03/24/2023    8:37 AM 01/25/2023    8:54 AM  Depression screen PHQ 2/9  Decreased Interest 0 0 0  Down, Depressed, Hopeless 0 0 0  PHQ - 2 Score 0 0 0    Relevant past medical, surgical, family and social history reviewed and updated as indicated. Interim medical history since our last visit reviewed. Allergies and medications reviewed and updated.  Review of Systems  Per HPI unless specifically indicated above     Objective:    There were no vitals taken for this visit.  {Vitals History (Optional):23777} Wt Readings from Last 3 Encounters:  07/27/23 164 lb (74.4 kg)  03/24/23 150 lb (68 kg)  03/04/23 156 lb 3.2 oz (70.9 kg)    Physical Exam  Results for orders placed or performed in visit on 07/27/23  Estrogens , total   Collection Time: 07/27/23  8:22 AM  Result Value Ref Range   Estrogen 79 pg/mL  FSH/LH   Collection Time: 07/27/23  8:22 AM  Result Value Ref Range   FSH 59.5 mIU/mL   LH 42.1 mIU/mL  Prolactin   Collection Time: 07/27/23  8:22 AM  Result Value Ref Range   Prolactin 10.3 ng/mL  TSH   Collection Time: 07/27/23  8:22 AM  Result Value Ref Range   TSH 3.75 mIU/L  CBC with Differential/Platelet   Collection Time: 07/27/23  8:22 AM  Result Value Ref Range   WBC 7.0 3.8 - 10.8 Thousand/uL   RBC 3.76 (L) 3.80 - 5.10 Million/uL   Hemoglobin 11.0 (L) 11.7 - 15.5 g/dL   HCT 66.3 (L) 64.9 - 54.9 %   MCV 89.4 80.0 - 100.0 fL   MCH 29.3 27.0 - 33.0 pg   MCHC 32.7 32.0 - 36.0 g/dL   RDW 87.0 88.9 - 84.9 %   Platelets 389 140 - 400 Thousand/uL   MPV 9.9  7.5 - 12.5 fL   Neutro Abs 3,493 1,500 - 7,800 cells/uL   Lymphs Abs 3,073 850 - 3,900 cells/uL   Absolute Monocytes 329 200 - 950 cells/uL   Eosinophils Absolute 63 15 - 500 cells/uL   Basophils Absolute 42 0 - 200 cells/uL   Neutrophils Relative % 49.9 %   Total Lymphocyte 43.9 %   Monocytes Relative 4.7 %   Eosinophils Relative 0.9 %   Basophils Relative 0.6 %  COMPLETE METABOLIC PANEL WITH GFR   Collection Time: 07/27/23  8:22 AM  Result Value Ref Range   Glucose, Bld 106 (H) 65 - 99 mg/dL   BUN 14 7 - 25 mg/dL   Creat 9.22 9.49 - 8.96 mg/dL   eGFR 92 > OR = 60 fO/fpw/8.26f7   BUN/Creatinine Ratio SEE NOTE: 6 - 22 (calc)   Sodium 142 135 - 146 mmol/L   Potassium 4.3 3.5 - 5.3 mmol/L   Chloride 108 98 - 110 mmol/L   CO2 26 20 - 32 mmol/L   Calcium  9.4 8.6 - 10.4 mg/dL   Total  Protein 6.8 6.1 - 8.1 g/dL   Albumin 4.1 3.6 - 5.1 g/dL   Globulin 2.7 1.9 - 3.7 g/dL (calc)   AG Ratio 1.5 1.0 - 2.5 (calc)   Total Bilirubin 0.3 0.2 - 1.2 mg/dL   Alkaline phosphatase (APISO) 102 37 - 153 U/L   AST 24 10 - 35 U/L   ALT 40 (H) 6 - 29 U/L  Lipid panel   Collection Time: 07/27/23  8:22 AM  Result Value Ref Range   Cholesterol 191 <200 mg/dL   HDL 60 > OR = 50 mg/dL   Triglycerides 881 <849 mg/dL   LDL Cholesterol (Calc) 108 (H) mg/dL (calc)   Total CHOL/HDL Ratio 3.2 <5.0 (calc)   Non-HDL Cholesterol (Calc) 131 (H) <130 mg/dL (calc)  Hemoglobin J8r   Collection Time: 07/27/23  8:22 AM  Result Value Ref Range   Hgb A1c MFr Bld 5.2 <5.7 % of total Hgb   Mean Plasma Glucose 103 mg/dL   eAG (mmol/L) 5.7 mmol/L  Testosterone , Total, LC/MS/MS   Collection Time: 07/27/23  8:22 AM  Result Value Ref Range   Testosterone , Total, LC-MS-MS 6 2 - 45 ng/dL   {Labs (Neupnwjo):76220}    Assessment & Plan:   Problem List Items Addressed This Visit       Cardiovascular and Mediastinum   Essential hypertension - Primary     Other   Mixed hyperlipidemia     Assessment and Plan              Follow up plan: No follow-ups on file.

## 2024-01-07 ENCOUNTER — Other Ambulatory Visit: Payer: Self-pay | Admitting: Nurse Practitioner

## 2024-01-07 DIAGNOSIS — E782 Mixed hyperlipidemia: Secondary | ICD-10-CM

## 2024-01-08 LAB — COMPLETE METABOLIC PANEL WITH GFR
AG Ratio: 1.4 (calc) (ref 1.0–2.5)
ALT: 15 U/L (ref 6–29)
AST: 16 U/L (ref 10–35)
Albumin: 4.4 g/dL (ref 3.6–5.1)
Alkaline phosphatase (APISO): 102 U/L (ref 37–153)
BUN: 11 mg/dL (ref 7–25)
CO2: 23 mmol/L (ref 20–32)
Calcium: 9.7 mg/dL (ref 8.6–10.4)
Chloride: 103 mmol/L (ref 98–110)
Creat: 0.79 mg/dL (ref 0.50–1.03)
Globulin: 3.1 g/dL (ref 1.9–3.7)
Glucose, Bld: 87 mg/dL (ref 65–99)
Potassium: 3.9 mmol/L (ref 3.5–5.3)
Sodium: 140 mmol/L (ref 135–146)
Total Bilirubin: 0.2 mg/dL (ref 0.2–1.2)
Total Protein: 7.5 g/dL (ref 6.1–8.1)
eGFR: 89 mL/min/{1.73_m2} (ref >=60)

## 2024-01-08 LAB — TEST AUTHORIZATION

## 2024-01-08 LAB — LIPID PANEL
Cholesterol: 190 mg/dL (ref <200)
HDL: 63 mg/dL (ref >=50)
LDL Cholesterol (Calc): 110 mg/dL — ABNORMAL HIGH
Non-HDL Cholesterol (Calc): 127 mg/dL (ref <130)
Total CHOL/HDL Ratio: 3 (calc) (ref <5.0)
Triglycerides: 84 mg/dL (ref <150)

## 2024-01-10 ENCOUNTER — Encounter: Payer: Self-pay | Admitting: Nurse Practitioner

## 2024-01-11 ENCOUNTER — Encounter: Payer: BC Managed Care – PPO | Admitting: Obstetrics

## 2024-01-11 NOTE — Progress Notes (Unsigned)
ANNUAL PREVENTATIVE CARE GYNECOLOGY  ENCOUNTER NOTE  Subjective:       Mackenzie Jones is a 53 y.o. G3P2 female here to establish care and for a routine annual gynecologic exam. Previously seen by a GYN in [place]. The patient is sexually active. The patient is taking hormone replacement therapy (has been on regimen for the past 6 months). Patient denies post-menopausal vaginal bleeding. The patient wears seatbelts: yes. The patient participates in regular exercise: no. Has the patient ever been transfused or tattooed?: no. The patient reports that there is not domestic violence in her life.  Current complaints: 1.  None    Gynecologic History No LMP recorded. Patient has had an ablation. Contraception:  ablation Last Pap: 01/16/2022 (documented by PCP, although no results noted in EPIC). Results were: normal Last mammogram: 01/21/2023. Results were: normal Last Colonoscopy: 03/04/2023.  Noted that colon polyps were found. Recommended repeat in 3 years Last Dexa Scan: Never done   Obstetric History OB History  Gravida Para Term Preterm AB Living  3 2    2   SAB IAB Ectopic Multiple Live Births          # Outcome Date GA Lbr Len/2nd Weight Sex Type Anes PTL Lv  3 Gravida           2 Para           1 Para             Past Medical History:  Diagnosis Date   Allergy    Anal fissure 03/20/2020   Anemia    Depression    G6PD deficiency    Pt reported histroy from when she was in the [employer]. No records.    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   Vitamin D deficiency     Family History  Problem Relation Age of Onset   Hypertension Mother    Hyperlipidemia Mother    Protein S deficiency Father    Heart disease Maternal Grandfather    Arthritis Paternal Grandmother    Hyperlipidemia Sister    Breast cancer Neg Hx     Past Surgical History:  Procedure Laterality Date   ABLATION     COLONOSCOPY WITH PROPOFOL N/A 03/04/2023   Procedure: COLONOSCOPY WITH  PROPOFOL;  Surgeon: Wyline Mood, MD;  Location: [facility];  Service: Gastroenterology;  Laterality: N/A;   lipo sucttion  2013   WISDOM TOOTH EXTRACTION  1996    Social History   Socioeconomic History   Marital status: Married    Spouse name: Mackenzie Jones   Number of children: 2   Years of education: Masters   Highest education level: Not on file  Occupational History   Occupation: Art gallery manager  Tobacco Use   Smoking status: Never   Smokeless tobacco: Never  Vaping Use   Vaping status: Never Used  Substance and Sexual Activity   Alcohol use: No   Drug use: No   Sexual activity: Yes    Partners: Male    Birth control/protection: Surgical    Comment: vas  Other Topics Concern   Not on file  Social History Narrative   Married to [place]. They have 2 daughters Mackenzie Jones and Mackenzie Jones).   Master's in Public relations account executive. Works full time.   Drinks caffeine, takes a daily vitamin.    Exercises routinely.   Smoke detector in the home.   Firearms locked in the home.    Feels safe in her relationships.    Works at  [employer]   Social Drivers of Health   Financial Resource Strain: Low Risk  (07/27/2023)   Overall Financial Resource Strain (CARDIA)    Difficulty of Paying Living Expenses: Not hard at all  Food Insecurity: No Food Insecurity (07/27/2023)   Hunger Vital Sign    Worried About Running Out of Food in the Last Year: Never true    Ran Out of Food in the Last Year: Never true  Transportation Needs: No Transportation Needs (07/27/2023)   PRAPARE - Administrator, Civil Service (Medical): No    Lack of Transportation (Non-Medical): No  Physical Activity: Insufficiently Active (07/27/2023)   Exercise Vital Sign    Days of Exercise per Week: 2 days    Minutes of Exercise per Session: 30 min  Stress: No Stress Concern Present (07/27/2023)   [employer] of Occupational Health - Occupational Stress Questionnaire    Feeling of Stress : Not at all  Social Connections: Socially  Integrated (07/27/2023)   Social Connection and Isolation Panel [NHANES]    Frequency of Communication with Friends and Family: More than three times a week    Frequency of Social Gatherings with Friends and Family: More than three times a week    Attends Religious Services: More than 4 times per year    Active Member of [employer] or Organizations: No    Attends Engineer, structural: More than 4 times per year    Marital Status: Married  Catering manager Violence: Not At Risk (07/27/2023)   Humiliation, Afraid, Rape, and Kick questionnaire    Fear of Current or Ex-Partner: No    Emotionally Abused: No    Physically Abused: No    Sexually Abused: No    Current Outpatient Medications on File Prior to Visit  Medication Sig Dispense Refill   clobetasol (TEMOVATE) 0.05 % external solution Apply topically 2 (two) times daily.     estradiol (CLIMARA - DOSED IN MG/24 HR) 0.075 mg/24hr patch Place 0.075 mg onto the skin once a week.     progesterone (PROMETRIUM) 100 MG capsule Take 1 tablet by mouth daily.     rosuvastatin (CRESTOR) 5 MG tablet TAKE 1 TABLET BY MOUTH EVERYDAY AT BEDTIME 90 tablet 1   valACYclovir (VALTREX) 500 MG tablet PRN     No current facility-administered medications on file prior to visit.    Allergies  Allergen Reactions   Prednisone Hives   Flagyl [Metronidazole]    Sulfa Antibiotics       Review of Systems ROS Review of Systems - General ROS: negative for - chills, fatigue, fever, hot flashes, night sweats, weight gain or weight loss Psychological ROS: negative for - anxiety, decreased libido, depression, mood swings, physical abuse or sexual abuse Ophthalmic ROS: negative for - blurry vision, eye pain or loss of vision ENT ROS: negative for - headaches, hearing change, visual changes or vocal changes Allergy and Immunology ROS: negative for - hives, itchy/watery eyes or seasonal allergies Hematological and Lymphatic ROS: negative for - bleeding  problems, bruising, swollen lymph nodes or weight loss Endocrine ROS: negative for - galactorrhea, hair pattern changes, hot flashes, malaise/lethargy, mood swings, palpitations, polydipsia/polyuria, skin changes, temperature intolerance or unexpected weight changes Breast ROS: negative for - new or changing breast lumps or nipple discharge Respiratory ROS: negative for - cough or shortness of breath Cardiovascular ROS: negative for - chest pain, irregular heartbeat, palpitations or shortness of breath Gastrointestinal ROS: no abdominal pain, change in bowel habits, or black  or bloody stools Genito-Urinary ROS: no dysuria, trouble voiding, or hematuria Musculoskeletal ROS: negative for - joint pain or joint stiffness Neurological ROS: negative for - bowel and bladder control changes Dermatological ROS: negative for rash and skin lesion changes   Objective:   BP (!) 141/91   Pulse 88   Resp 16   Ht 5' (1.524 m)   Wt 168 lb 3.2 oz (76.3 kg)   BMI 32.85 kg/m  CONSTITUTIONAL: Well-developed, well-nourished female in no acute distress.  PSYCHIATRIC: Normal mood and affect. Normal behavior. Normal judgment and thought content. NEUROLGIC: Alert and oriented to person, place, and time. Normal muscle tone coordination. No cranial nerve deficit noted. HENT:  Normocephalic, atraumatic, External right and left ear normal. Oropharynx is clear and moist EYES: Conjunctivae and EOM are normal. Pupils are equal, round, and reactive to light. No scleral icterus.  NECK: Normal range of motion, supple, no masses.  Normal thyroid.  SKIN: Skin is warm and dry. No rash noted. Not diaphoretic. No erythema. No pallor. CARDIOVASCULAR: Normal heart rate noted, regular rhythm, no murmur. RESPIRATORY: Clear to auscultation bilaterally. Effort and breath sounds normal, no problems with respiration noted. BREASTS: Symmetric in size. No masses, skin changes, nipple drainage, or lymphadenopathy. ABDOMEN: Soft, normal  bowel sounds, no distention noted.  No tenderness, rebound or guarding.  BLADDER: Normal PELVIC:  Bladder no bladder distension noted  Urethra: normal appearing urethra with no masses, tenderness or lesions  Vulva: normal appearing vulva with no masses, tenderness or lesions  Vagina: normal appearing vagina with normal color and discharge, no lesions  Cervix: normal appearing cervix without discharge or lesions  Uterus: uterus is normal size, shape, consistency and nontender  Adnexa: normal adnexa in size, nontender and no masses  RV: External Exam NormaI, No Rectal Masses, and Normal Sphincter tone  MUSCULOSKELETAL: Normal range of motion. No tenderness.  No cyanosis, clubbing, or edema.  2+ distal pulses. LYMPHATIC: No Axillary, Supraclavicular, or Inguinal Adenopathy.   Labs: Lab Results  Component Value Date   WBC 7.0 07/27/2023   HGB 11.0 (L) 07/27/2023   HCT 33.6 (L) 07/27/2023   MCV 89.4 07/27/2023   PLT 389 07/27/2023    Lab Results  Component Value Date   CREATININE 0.79 01/06/2024   BUN 11 01/06/2024   NA 140 01/06/2024   K 3.9 01/06/2024   CL 103 01/06/2024   CO2 23 01/06/2024    Lab Results  Component Value Date   ALT 15 01/06/2024   AST 16 01/06/2024   ALKPHOS 119 (H) 11/17/2019   BILITOT 0.2 01/06/2024    Lab Results  Component Value Date   CHOL 190 01/06/2024   HDL 63 01/06/2024   LDLCALC 110 (H) 01/06/2024   TRIG 84 01/06/2024   CHOLHDL 3.0 01/06/2024    Lab Results  Component Value Date   TSH 3.75 07/27/2023    Lab Results  Component Value Date   HGBA1C 5.2 07/27/2023     Assessment:   1. Encounter for well woman exam with routine gynecological exam   2. Establishing care with new doctor, encounter for   3. Post-menopause on HRT (hormone replacement therapy)   4. Obesity (BMI 30.0-34.9)   5. Essential hypertension      Plan:   1. Encounter for well woman exam with routine gynecological exam (Primary) Pap:  Up to date. Can  perform q 5 year screens due to low-risk history.  Mammogram: Ordered by PCP. To schedule at the end of this month.  Colon Screening:   Up to date. Labs:  Performed by by PCP Routine preventative health maintenance measures emphasized: Exercise/Diet/Weight control, Tobacco Warnings, Alcohol/Substance use risks, and Stress Management Flu vaccine status:Up to date.   2. Establishing care with new doctor, encounter for - Well woman exam performed today.   3. Post-menopause on HRT (hormone replacement therapy) - Recently initiated on HRT for menopausal vasomotor symptoms ~ 6 months ago. Notes that they are well controlled on current dosing.  Reviewed study data showing increased risk of thrombo-embolic events such as myocardial infarction stroke and breast cancer after 4 or more years exposure to combination products with estrogen and progesterone. She understands the benefits as well as the risks discussed above. She  is advised that she may wish to discontinue the HRT at any time, and encouraged to discuss this with her other physicians also. Her personal risk factors have been reviewed carefully with her today.  Can continue therapy up to this time and then for reassessment of symptoms.   4. Obesity (BMI 30.0-34.9) - Lifestyle measures emphasized.   5. Essential hypertension - Patient denies history of HTN at this time. Notes that she used to be on medications but was taken off in 2022 (review of chart notes Amlodipine 5 mg daily). Has BP cuff at home, encouraged to monitor and if she continues to get elevated readings, to follow up with her PCP.   Return to Clinic - 1 Year   Hildred Laser, MD [facility]

## 2024-01-12 ENCOUNTER — Encounter: Payer: Self-pay | Admitting: Obstetrics and Gynecology

## 2024-01-12 ENCOUNTER — Ambulatory Visit (INDEPENDENT_AMBULATORY_CARE_PROVIDER_SITE_OTHER): Payer: BC Managed Care – PPO | Admitting: Obstetrics and Gynecology

## 2024-01-12 VITALS — BP 141/91 | HR 88 | Resp 16 | Ht 60.0 in | Wt 168.2 lb

## 2024-01-12 DIAGNOSIS — Z01419 Encounter for gynecological examination (general) (routine) without abnormal findings: Secondary | ICD-10-CM

## 2024-01-12 DIAGNOSIS — Z7689 Persons encountering health services in other specified circumstances: Secondary | ICD-10-CM

## 2024-01-12 DIAGNOSIS — I10 Essential (primary) hypertension: Secondary | ICD-10-CM

## 2024-01-12 DIAGNOSIS — E66811 Obesity, class 1: Secondary | ICD-10-CM

## 2024-01-12 DIAGNOSIS — Z79890 Hormone replacement therapy (postmenopausal): Secondary | ICD-10-CM

## 2024-01-12 NOTE — Patient Instructions (Signed)
Preventive Care 60-54 Years Old, Female  Preventive care refers to lifestyle choices and visits with your health care provider that can promote health and wellness. Preventive care visits are also called wellness exams. What can I expect for my preventive care visit? Counseling Your health care provider may ask you questions about your: Medical history, including: Past medical problems. Family medical history. Pregnancy history. Current health, including: Menstrual cycle. Method of birth control. Emotional well-being. Home life and relationship well-being. Sexual activity and sexual health. Lifestyle, including: Alcohol, nicotine or tobacco, and drug use. Access to firearms. Diet, exercise, and sleep habits. Work and work Astronomer. Sunscreen use. Safety issues such as seatbelt and bike helmet use. Physical exam Your health care provider will check your: Height and weight. These may be used to calculate your BMI (body mass index). BMI is a measurement that tells if you are at a healthy weight. Waist circumference. This measures the distance around your waistline. This measurement also tells if you are at a healthy weight and may help predict your risk of certain diseases, such as type 2 diabetes and high blood pressure. Heart rate and blood pressure. Body temperature. Skin for abnormal spots. What immunizations do I need?  Vaccines are usually given at various ages, according to a schedule. Your health care provider will recommend vaccines for you based on your age, medical history, and lifestyle or other factors, such as travel or where you work. What tests do I need? Screening Your health care provider may recommend screening tests for certain conditions. This may include: Lipid and cholesterol levels. Diabetes screening. This is done by checking your blood sugar (glucose) after you have not eaten for a while (fasting). Pelvic exam and Pap test. Hepatitis B test. Hepatitis  C test. HIV (human immunodeficiency virus) test. STI (sexually transmitted infection) testing, if you are at risk. Lung cancer screening. Colorectal cancer screening. Mammogram. Talk with your health care provider about when you should start having regular mammograms. This may depend on whether you have a family history of breast cancer. BRCA-related cancer screening. This may be done if you have a family history of breast, ovarian, tubal, or peritoneal cancers. Bone density scan. This is done to screen for osteoporosis. Talk with your health care provider about your test results, treatment options, and if necessary, the need for more tests. Follow these instructions at home: Eating and drinking  Eat a diet that includes fresh fruits and vegetables, whole grains, lean protein, and low-fat dairy products. Take vitamin and mineral supplements as recommended by your health care provider. Do not drink alcohol if: Your health care provider tells you not to drink. You are pregnant, may be pregnant, or are planning to become pregnant. If you drink alcohol: Limit how much you have to 0-1 drink a day. Know how much alcohol is in your drink. In the [place] one drink equals one 12 oz bottle of beer (355 mL), one 5 oz glass of wine (148 mL), or one 1 oz glass of hard liquor (44 mL). Lifestyle Brush your teeth every morning and night with fluoride toothpaste. Floss one time each day. Exercise for at least 30 minutes 5 or more days each week. Do not use any products that contain nicotine or tobacco. These products include cigarettes, chewing tobacco, and vaping devices, such as e-cigarettes. If you need help quitting, ask your health care provider. Do not use drugs. If you are sexually active, practice safe sex. Use a condom or other form of protection  to prevent STIs. If you do not wish to become pregnant, use a form of birth control. If you plan to become pregnant, see your health care provider for a  prepregnancy visit. Take aspirin only as told by your health care provider. Make sure that you understand how much to take and what form to take. Work with your health care provider to find out whether it is safe and beneficial for you to take aspirin daily. Find healthy ways to manage stress, such as: Meditation, yoga, or listening to music. Journaling. Talking to a trusted person. Spending time with friends and family. Minimize exposure to UV radiation to reduce your risk of skin cancer. Safety Always wear your seat belt while driving or riding in a vehicle. Do not drive: If you have been drinking alcohol. Do not ride with someone who has been drinking. When you are tired or distracted. While texting. If you have been using any mind-altering substances or drugs. Wear a helmet and other protective equipment during sports activities. If you have firearms in your house, make sure you follow all gun safety procedures. Seek help if you have been physically or sexually abused. What's next? Visit your health care provider once a year for an annual wellness visit. Ask your health care provider how often you should have your eyes and teeth checked. Stay up to date on all vaccines. This information is not intended to replace advice given to you by your health care provider. Make sure you discuss any questions you have with your health care provider. Document Revised: 05/14/2021 Document Reviewed: 05/14/2021 Elsevier Patient Education  2024 Elsevier Inc.  Breast self-awareness  Breast self-awareness is knowing how your breasts look and feel. You need to: Check your breasts on a regular basis. Tell your doctor about any changes. Become familiar with the look and feel of your breasts. This can help you catch a breast problem while it is still small and can be treated. You should do breast self-exams even if you have breast implants. What you need: A mirror. A well-lit room. A pillow or other  soft object. How to do a breast self-exam Follow these steps to do a breast self-exam: Look for changes  Take off all the clothes above your waist. Stand in front of a mirror in a room with good lighting. Put your hands down at your sides. Compare your breasts in the mirror. Look for any difference between them, such as: A difference in shape. A difference in size. Wrinkles, dips, and bumps in one breast and not the other. Look at each breast for changes in the skin, such as: Redness. Scaly areas. Skin that has gotten thicker. Dimpling. Open sores (ulcers). Look for changes in your nipples, such as: Fluid coming out of a nipple. Fluid around a nipple. Bleeding. Dimpling. Redness. A nipple that looks pushed in (retracted), or that has changed position. Feel for changes Lie on your back. Feel each breast. To do this: Pick a breast to feel. Place a pillow under the shoulder closest to that breast. Put the arm closest to that breast behind your head. Feel the nipple area of that breast using the hand of your other arm. Feel the area with the pads of your three middle fingers by making small circles with your fingers. Use light, medium, and firm pressure. Continue the overlapping circles, moving downward over the breast. Keep making circles with your fingers. Stop when you feel your ribs. Start making circles with your fingers again,  this time going upward until you reach your collarbone. Then, make circles outward across your breast and into your armpit area. Squeeze your nipple. Check for discharge and lumps. Repeat these steps to check your other breast. Sit or stand in the tub or shower. With soapy water on your skin, feel each breast the same way you did when you were lying down. Write down what you find Writing down what you find can help you remember what to tell your doctor. Write down: What is normal for each breast. Any changes you find in each breast. These  include: The kind of changes you find. A tender or painful breast. Any lump you find. Write down its size and where it is. When you last had your monthly period (menstrual cycle). General tips If you are breastfeeding, the best time to check your breasts is after you feed your baby or after you use a breast pump. If you get monthly bleeding, the best time to check your breasts is 5-7 days after your monthly cycle ends. With time, you will become comfortable with the self-exam. You will also start to know if there are changes in your breasts. Contact a doctor if: You see a change in the shape or size of your breasts or nipples. You see a change in the skin of your breast or nipples, such as red or scaly skin. You have fluid coming from your nipples that is not normal. You find a new lump or thick area. You have breast pain. You have any concerns about your breast health. Summary Breast self-awareness includes looking for changes in your breasts and feeling for changes within your breasts. You should do breast self-awareness in front of a mirror in a well-lit room. If you get monthly periods (menstrual cycles), the best time to check your breasts is 5-7 days after your period ends. Tell your doctor about any changes you see in your breasts. Changes include changes in size, changes on the skin, painful or tender breasts, or fluid from your nipples that is not normal. This information is not intended to replace advice given to you by your health care provider. Make sure you discuss any questions you have with your health care provider. Document Revised: 04/23/2022 Document Reviewed: 09/18/2021 Elsevier Patient Education  2024 [employer].

## 2024-01-27 DIAGNOSIS — L218 Other seborrheic dermatitis: Secondary | ICD-10-CM | POA: Diagnosis not present

## 2024-03-20 ENCOUNTER — Ambulatory Visit: Admitting: Nurse Practitioner

## 2024-03-22 ENCOUNTER — Ambulatory Visit: Admitting: Nurse Practitioner

## 2024-04-25 ENCOUNTER — Telehealth: Payer: Self-pay | Admitting: Emergency Medicine

## 2024-04-25 ENCOUNTER — Other Ambulatory Visit: Payer: Self-pay | Admitting: Nurse Practitioner

## 2024-04-25 ENCOUNTER — Ambulatory Visit (INDEPENDENT_AMBULATORY_CARE_PROVIDER_SITE_OTHER): Admitting: Nurse Practitioner

## 2024-04-25 VITALS — BP 150/88 | Ht 60.0 in

## 2024-04-25 DIAGNOSIS — R110 Nausea: Secondary | ICD-10-CM | POA: Diagnosis not present

## 2024-04-25 DIAGNOSIS — I10 Essential (primary) hypertension: Secondary | ICD-10-CM | POA: Diagnosis not present

## 2024-04-25 DIAGNOSIS — R42 Dizziness and giddiness: Secondary | ICD-10-CM

## 2024-04-25 MED ORDER — MECLIZINE HCL 25 MG PO TABS: 25.0000 mg | ORAL_TABLET | Freq: Three times a day (TID) | ORAL | 0 refills | Status: DC | PRN

## 2024-04-25 MED ORDER — ONDANSETRON 4 MG PO TBDP: 4.0000 mg | ORAL_TABLET | Freq: Three times a day (TID) | ORAL | 0 refills | Status: DC | PRN

## 2024-04-25 MED ORDER — AMLODIPINE BESYLATE 5 MG PO TABS: 5.0000 mg | ORAL_TABLET | Freq: Every day | ORAL | 0 refills | Status: DC

## 2024-04-25 NOTE — Telephone Encounter (Signed)
 Copied from CRM [id number]. Topic: Clinical - Prescription Issue >> Apr 25, 2024 11:50 AM Loreda Rodriguez T wrote: Reason for CRM: [facility] called stated the [facility] was out of [facility]ine (ANTIVERT) 25 MG tablet and patient wanted the script transferred to the [id number], [place] - [address] AT [facility] [address] [facility] [address]  Phone: [phone] Fax: [phone]

## 2024-04-25 NOTE — Progress Notes (Signed)
 BP (!) 150/88   Ht 5' (1.524 m)   BMI 32.85 kg/m    Subjective:    Patient ID: Mackenzie Jones, female    DOB: 1971/10/27, 54 y.o.   MRN: [id number]  HPI: Mackenzie Jones is a 53 y.o. female  Chief Complaint  Patient presents with   Hypertension    Patient says she donated blood twice in the past 6 months and her blood pressure was elevated both times 163/99 (Week ago)  164/94 (Feb)   Dizziness    nausea, diarrhea onset 24H, fatigue   Edema    L hand, patient feels like there is pressure on the hand and was not able to get ring on last week due to swelling     Discussed the use of AI scribe software for clinical note transcription with the patient, who gave verbal consent to proceed.  History of Present Illness Mackenzie Jones is a 53 year old female with a history of hypertension who presents with elevated blood pressure and associated symptoms.  Over the past six months, she has experienced elevated blood pressure readings, with a reading of 141/91 mmHg noted during a GYN appointment in February and subsequent readings of 163/99 mmHg and 164/94 mmHg during blood donation visits. Her current blood pressure is 150/88 mmHg. She has a history of hypertension and was previously on amlodipine , which she discontinued in 2020 or 2021 after her blood pressure stabilized.  She reports swelling in her left hand, which she associates with previous spinal compression issues. Additionally, she has experienced nausea and lightheadedness over the past 24 hours, with dizziness occurring when bending over or standing up. She also experienced blurred vision when trying to focus on her watch this morning. No headaches, chest pain, or leg swelling. She reports watery diarrhea in the last 24 hours.  She attributes her nausea partly to sinus drainage into her stomach, which has been ongoing for one to two weeks. She has been using Claritin and Flonase for her sinus symptoms. She acknowledges not drinking enough  water recently.         04/25/2024   11:09 AM 01/12/2024    2:27 PM 07/27/2023    7:50 AM  Depression screen PHQ 2/9  Decreased Interest 0 0 0  Down, Depressed, Hopeless 0 0 0  PHQ - 2 Score 0 0 0  Altered sleeping 0    Tired, decreased energy 0    Change in appetite 0    Feeling bad or failure about yourself  0    Trouble concentrating 0    Moving slowly or fidgety/restless 0    Suicidal thoughts 0    PHQ-9 Score 0    Difficult doing work/chores Not difficult at all      Relevant past medical, surgical, family and social history reviewed and updated as indicated. Interim medical history since our last visit reviewed. Allergies and medications reviewed and updated.  Review of Systems  Ten systems reviewed and is negative except as mentioned in HPI      Objective:      BP (!) 150/88   Ht 5' (1.524 m)   BMI 32.85 kg/m    Wt Readings from Last 3 Encounters:  01/12/24 168 lb 3.2 oz (76.3 kg)  07/27/23 164 lb (74.4 kg)  03/24/23 150 lb (68 kg)    Physical Exam Vitals reviewed.  Constitutional:      Appearance: Normal appearance.  HENT:     Head: Normocephalic.  Cardiovascular:  Rate and Rhythm: Normal rate and regular rhythm.  Pulmonary:     Effort: Pulmonary effort is normal.     Breath sounds: Normal breath sounds.  Musculoskeletal:        General: Normal range of motion.  Skin:    General: Skin is warm and dry.  Neurological:     General: No focal deficit present.     Mental Status: She is alert and oriented to person, place, and time. Mental status is at baseline.     Cranial Nerves: Cranial nerves 2-12 are intact.     Sensory: Sensation is intact.     Motor: Motor function is intact.     Coordination: Coordination is intact. Romberg sign negative.  Psychiatric:        Mood and Affect: Mood normal.        Behavior: Behavior normal.        Thought Content: Thought content normal.        Judgment: Judgment normal.        Results for orders  placed or performed in visit on 01/06/24  Lipid panel   Collection Time: 01/06/24 11:29 AM  Result Value Ref Range   Cholesterol 190 <200 mg/dL   HDL 63 > OR = 50 mg/dL   Triglycerides 84 <540 mg/dL   LDL Cholesterol (Calc) 110 (H) mg/dL (calc)   Total CHOL/HDL Ratio 3.0 <5.0 (calc)   Non-HDL Cholesterol (Calc) 127 <130 mg/dL (calc)  COMPLETE METABOLIC PANEL WITH GFR   Collection Time: 01/06/24 11:29 AM  Result Value Ref Range   Glucose, Bld 87 65 - 99 mg/dL   BUN 11 7 - 25 mg/dL   Creat 9.81 1.91 - 4.78 mg/dL   eGFR 89 > OR = 60 GN/FAO/1.30Q6   BUN/Creatinine Ratio SEE NOTE: 6 - 22 (calc)   Sodium 140 135 - 146 mmol/L   Potassium 3.9 3.5 - 5.3 mmol/L   Chloride 103 98 - 110 mmol/L   CO2 23 20 - 32 mmol/L   Calcium  9.7 8.6 - 10.4 mg/dL   Total Protein 7.5 6.1 - 8.1 g/dL   Albumin 4.4 3.6 - 5.1 g/dL   Globulin 3.1 1.9 - 3.7 g/dL (calc)   AG Ratio 1.4 1.0 - 2.5 (calc)   Total Bilirubin 0.2 0.2 - 1.2 mg/dL   Alkaline phosphatase (APISO) 102 37 - 153 U/L   AST 16 10 - 35 U/L   ALT 15 6 - 29 U/L  TEST AUTHORIZATION   Collection Time: 01/06/24 11:29 AM  Result Value Ref Range   TEST NAME: COMPREHENSIVE METABOLIC    TEST CODE: [id number]    CLIENT CONTACT: Xiong Haidar    REPORT ALWAYS MESSAGE SIGNATURE            Assessment & Plan:   Problem List Items Addressed This Visit       Cardiovascular and Mediastinum   Essential hypertension - Primary   Relevant Medications   amLODipine  (NORVASC ) 5 MG tablet   Other Visit Diagnoses       Nausea       Relevant Medications   ondansetron (ZOFRAN-ODT) 4 MG disintegrating tablet     Dizziness       Relevant Medications   meclizine (ANTIVERT) 25 MG tablet        Assessment and Plan Assessment & Plan Nausea and dizziness Recent onset of nausea and dizziness, likely related to sinus drainage or viral illness. Symptoms include lightheadedness upon standing and blurred vision. Normal neurological examination  reduces  concern for a central cause. - Prescribe Zofran for nausea. - Prescribe meclizine for dizziness, noting potential drowsiness and drying effect. - Encourage adequate hydration.  Sinus drainage Sinus drainage for 1-2 weeks, potentially contributing to nausea. Currently using Flonase and Claritin, though Claritin's effectiveness is uncertain. - Continue Flonase and Claritin.  Diarrhea Acute onset of watery diarrhea in the last 24 hours, possibly due to viral gastroenteritis. No abdominal pain reported.  Hypertension Hypertension with recent elevated readings: 163/99 and 164/94 during blood donation, 150/88 today. Previously on amlodipine , discontinued after stabilization. Current symptoms include lightheadedness and blurred vision, possibly related to elevated blood pressure. Normal neurological examination reduces concern for a central cause. - Restart amlodipine  at a low dose. - Check blood pressure at home and report readings. - If symptoms worsen, go to the emergency room. - Follow up in 4 weeks to assess blood pressure management.        Follow up plan: Return in about 4 weeks (around 05/23/2024) for send message in morning about how you are feeling,   er if worsening symptoms,  .

## 2024-04-26 ENCOUNTER — Encounter: Payer: Self-pay | Admitting: Nurse Practitioner

## 2024-05-08 ENCOUNTER — Other Ambulatory Visit: Payer: Self-pay

## 2024-05-08 NOTE — Telephone Encounter (Signed)
 Good morning Mackenzie Jones, This patient will be seeing you on June 16,2025.  She was a Dr. Denman Fischer, patient.  She is trying to schedule her mammogram and needs a new order placed, due to DR. Cherry being gone. Please advise once this is complete.  Charlene

## 2024-05-09 ENCOUNTER — Other Ambulatory Visit: Payer: Self-pay | Admitting: Certified Nurse Midwife

## 2024-05-09 ENCOUNTER — Other Ambulatory Visit: Payer: Self-pay | Admitting: Nurse Practitioner

## 2024-05-09 DIAGNOSIS — Z1231 Encounter for screening mammogram for malignant neoplasm of breast: Secondary | ICD-10-CM

## 2024-05-11 NOTE — Progress Notes (Signed)
    GYNECOLOGY PROGRESS NOTE  Subjective:    Patient ID: Mackenzie Jones, female    DOB: 1971/03/17, 53 y.o.   MRN: [id number]  HPI  Patient is a 53 y.o. G3P2 female who presents for medication management. She has been on the Estrodiol patch for almost 1 year. She notes that she has started waking up again with night sweats. Patch continues to manage her other menopausal symptoms besides the night sweats.  The following portions of the patient's history were reviewed and updated as appropriate: allergies, current medications, past family history, past medical history, past social history, past surgical history, and problem list.  Review of Systems Pertinent items noted in HPI and remainder of comprehensive ROS otherwise negative.   Objective:   Blood pressure 124/72, pulse 97, resp. rate 16, height 5' (1.524 m), weight 171 lb 14.4 oz (78 kg). Body mass index is 33.57 kg/m. General appearance: alert, cooperative, and no distress    Assessment:   1. Hot flashes due to menopause      Plan:   Will increase Climera dosage to 0.1mg  since she had initial improvement of symptoms before. Reviewed this is the max dose for Climera. Gave information on Veozah for hot flashes.    Donato Fu, CNM [facility]

## 2024-05-15 ENCOUNTER — Ambulatory Visit (INDEPENDENT_AMBULATORY_CARE_PROVIDER_SITE_OTHER): Admitting: Certified Nurse Midwife

## 2024-05-15 VITALS — BP 124/72 | HR 97 | Resp 16 | Ht 60.0 in | Wt 171.9 lb

## 2024-05-15 DIAGNOSIS — N951 Menopausal and female climacteric states: Secondary | ICD-10-CM | POA: Diagnosis not present

## 2024-05-15 MED ORDER — ESTRADIOL 0.1 MG/24HR TD PTWK: 0.1000 mg | MEDICATED_PATCH | TRANSDERMAL | 3 refills | Status: AC

## 2024-05-17 ENCOUNTER — Other Ambulatory Visit: Payer: Self-pay | Admitting: Nurse Practitioner

## 2024-05-17 DIAGNOSIS — I10 Essential (primary) hypertension: Secondary | ICD-10-CM

## 2024-05-19 NOTE — Telephone Encounter (Signed)
 Requested Prescriptions  Pending Prescriptions Disp Refills   amLODipine  (NORVASC ) 5 MG tablet [Pharmacy Med Name: AMLODIPINE  BESYLATE 5 MG TAB] 30 tablet 0    Sig: TAKE 1 TABLET (5 MG TOTAL) BY MOUTH DAILY.     Cardiovascular: Calcium  Channel Blockers 2 Failed - 05/19/2024  2:00 PM      Failed - Valid encounter within last 6 months    Recent Outpatient Visits           3 weeks ago Essential hypertension   [facility] Quinton Buckler, FNP   7 years ago Calf pain, left   [facility] [place], Natalie, DO   7 years ago Obesity (BMI 30.0-34.9)   [facility] [facility] Morin, Natalie, DO   8 years ago Annual physical exam   [facility], Natalie, DO   8 years ago Right arm pain   [place] [facility] [place], Butch Cashing, MD       Future Appointments             In 6 days Abram Hoguet Monalisa Angles, FNP [facility]   In 2 months Abram Hoguet, Monalisa Angles, FNP [facility]            Passed - Last BP in normal range    BP Readings from Last 1 Encounters:  05/15/24 124/72         Passed - Last Heart Rate in normal range    Pulse Readings from Last 1 Encounters:  05/15/24 97

## 2024-05-21 ENCOUNTER — Ambulatory Visit
Admission: EM | Admit: 2024-05-21 | Discharge: 2024-05-21 | Disposition: A | Discharge status: Home / Self Care | Attending: Physician Assistant | Admitting: Physician Assistant

## 2024-05-21 ENCOUNTER — Encounter: Payer: Self-pay | Admitting: Emergency Medicine

## 2024-05-21 DIAGNOSIS — M546 Pain in thoracic spine: Secondary | ICD-10-CM | POA: Diagnosis not present

## 2024-05-21 NOTE — ED Triage Notes (Signed)
 Patient reports left upper back pain that started this morning.  Patient reports pain is worse with movement.  Patient denies chest pain, SOB, or arm pain.  Patient denies any injury.

## 2024-05-21 NOTE — Discharge Instructions (Addendum)
 BACK PAIN: Stressed avoiding painful activities . RICE (REST, ICE, COMPRESSION, ELEVATION) guidelines reviewed. May alternate ice and heat. Consider use of muscle rubs, Salonpas patches, etc. Use medications as directed including muscle relaxers if prescribed. Take anti-inflammatory medications as prescribed or OTC NSAIDs/Tylenol.  F/u with PCP in 7-10 days for reexamination, and please feel free to call or return to the urgent care at any time for any questions or concerns you may have and we will be happy to help you!   BACK PAIN RED FLAGS: If the back pain acutely worsens or there are any red flag symptoms such as numbness/tingling, leg weakness, saddle anesthesia, or loss of bowel/bladder control, go immediately to the ER. Follow up with [place] as scheduled or sooner if the pain does not begin to resolve or if it worsens before the follow up

## 2024-05-21 NOTE — ED Provider Notes (Signed)
 [facility]    CSN: [id number] Arrival date & time: 05/21/24  1505      History   Chief Complaint Chief Complaint  Patient presents with   Back Pain    Left upper    HPI Mackenzie Jones is a 53 y.o. female presenting for left upper back pain that began this morning.  She says there is a very specific spot that is sore to touch.  She says her husband found the exact spot.  She denies any sort of injuries and has not been overly active with lifting, reaching, stretching or anything that could have pulled a muscle.  She says it does feel like a pulled muscle though.  The pain does not radiate.  Denies chest pain, shortness of breath, dizziness, numbness/tingling, weakness, abdominal pain, nausea/vomiting, sweats.  Patient says she is about to go out of town on vacation and just wanted  to be checked out, have a EKG and make sure everything is okay.  States that she is aware women present differently with heart attacks and wants to make sure this is not that.  Her medical history includes hypertension, hyperlipidemia.  She has no history of any heart issues and is a non-smoker.  Denies fever, cough, congestion or pleuritic pain.  Pain is worse when she flexes her thoracic back forward and is relieved somewhat when she arches her back.  She has not taken anything for pain relief, apply ice, heat, muscle rubs or any other at home remedies.  HPI  Past Medical History:  Diagnosis Date   Allergy    Anal fissure 03/20/2020   Anemia    Depression    G6PD deficiency    Pt reported histroy from when she was in the [employer]. No records.    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   Vitamin D  deficiency     Patient Active Problem List   Diagnosis Date Noted   Hot flashes due to menopause 05/15/2024   Adenomatous polyp of colon 03/04/2023   Overweight 01/25/2023   Anal fissure 07/24/2022   Anorectal disorder 07/24/2022   Bleeding per rectum 07/24/2022   Flatulence,  eructation and gas pain 07/24/2022   Hemorrhoids 07/24/2022   Screening for colon cancer 07/24/2022   Essential hypertension 11/17/2019   Mixed hyperlipidemia 11/17/2019   Fibroma 01/19/2019   Family history of protein S deficiency 10/19/2017   Vitamin D  deficiency 01/18/2017   History of PCR DNA positive for HSV2    Right arm pain 10/01/2015    Past Surgical History:  Procedure Laterality Date   ABLATION     COLONOSCOPY WITH PROPOFOL  N/A 03/04/2023   Procedure: COLONOSCOPY WITH PROPOFOL ;  Surgeon: Therisa Bi, MD;  Location: [facility] END[facility]COPY;  Service: Gastroenterology;  Laterality: N/A;   lipo sucttion  2013   WISDOM TOOTH EXTRACTION  1996    OB History     Gravida  3   Para  2   Term      Preterm      AB      Living  2      SAB      IAB      Ectopic      Multiple      Live Births               Home Medications    Prior to Admission medications   Medication Sig Start Date End Date Taking? Authorizing Provider  amLODipine  (NORVASC ) 5  MG tablet TAKE 1 TABLET (5 MG TOTAL) BY MOUTH DAILY. 05/19/24   Pender, Julie F, FNP  clobetasol (TEMOVATE) 0.05 % external solution Apply topically 2 (two) times daily. 06/30/22   [provider]  estradiol (CLIMARA) 0.1 mg/24hr patch Place 1 patch (0.1 mg total) onto the skin once a week. 05/15/24   Slaughterbeck, Damien, CNM  meclizine  (ANTIVERT ) 25 MG tablet Take 1 tablet (25 mg total) by mouth 3 (three) times daily as needed for dizziness. 04/25/24   Pender, Julie F, FNP  ondansetron  (ZOFRAN -ODT) 4 MG disintegrating tablet Take 1 tablet (4 mg total) by mouth every 8 (eight) hours as needed for nausea or vomiting. 04/25/24   Pender, Julie F, FNP  progesterone (PROMETRIUM) 100 MG capsule Take 1 tablet by mouth daily.    [provider]  rosuvastatin  (CRESTOR ) 5 MG tablet TAKE 1 TABLET BY MOUTH EVERYDAY AT BEDTIME 11/18/23   Gareth Mliss FALCON, FNP  valACYclovir  (VALTREX ) 500 MG tablet PRN 12/01/16   [provider]    Family History Family History  Problem Relation Age of Onset   Hypertension Mother    Hyperlipidemia Mother    Protein S deficiency Father    Heart disease Maternal Grandfather    Arthritis Paternal Grandmother    Hyperlipidemia Sister    Breast cancer Neg Hx     Social History Social History   Tobacco Use   Smoking status: Never   Smokeless tobacco: Never  Vaping Use   Vaping status: Never Used  Substance Use Topics   Alcohol use: No   Drug use: No     Allergies   Prednisone, Flagyl [metronidazole], and Sulfa antibiotics   Review of Systems Review of Systems  Constitutional:  Negative for fatigue and fever.  Respiratory:  Negative for cough, shortness of breath and wheezing.   Cardiovascular:  Negative for chest pain and palpitations.  Gastrointestinal:  Negative for abdominal pain, nausea and vomiting.  Musculoskeletal:  Positive for back pain. Negative for neck pain.  Neurological:  Negative for dizziness, weakness, numbness and headaches.     Physical Exam Triage Vital Signs ED Triage Vitals  Encounter Vitals Group     BP 05/21/24 1515 135/81     Girls Systolic BP Percentile --      Girls Diastolic BP Percentile --      Boys Systolic BP Percentile --      Boys Diastolic BP Percentile --      Pulse Rate 05/21/24 1515 (!) 102     Resp 05/21/24 1515 14     Temp 05/21/24 1515 98.3 F (36.8 C)     Temp Source 05/21/24 1515 Oral     SpO2 05/21/24 1515 100 %     Weight 05/21/24 1514 170 lb (77.1 kg)     Height 05/21/24 1514 5' (1.524 m)     Head Circumference --      Peak Flow --      Pain Score 05/21/24 1514 6     Pain Loc --      Pain Education --      Exclude from Growth Chart --    No data found.  Updated Vital Signs BP 135/81 (BP Location: Left Arm)   Pulse (!) 102   Temp 98.3 F (36.8 C) (Oral)   Resp 14   Ht 5' (1.524 m)   Wt 170 lb (77.1 kg)   SpO2 100%   BMI 33.20 kg/m   Visual Acuity Right Eye Distance:  Left Eye Distance:   Bilateral Distance:    Right Eye Near:   Left Eye Near:    Bilateral Near:     Physical Exam Vitals and nursing note reviewed.  Constitutional:      General: She is not in acute distress.    Appearance: Normal appearance. She is not ill-appearing or toxic-appearing.  HENT:     Head: Normocephalic and atraumatic.   Eyes:     General: No scleral icterus.       Right eye: No discharge.        Left eye: No discharge.     Conjunctiva/sclera: Conjunctivae normal.    Cardiovascular:     Rate and Rhythm: Regular rhythm. Tachycardia present.     Heart sounds: Normal heart sounds.  Pulmonary:     Effort: Pulmonary effort is normal. No respiratory distress.     Breath sounds: Normal breath sounds.   Musculoskeletal:     Cervical back: Neck supple.     Comments: BACK: There is TTP to left lower prothoracic muscles. No spinal tenderness   Skin:    General: Skin is dry.   Neurological:     General: No focal deficit present.     Mental Status: She is alert. Mental status is at baseline.     Motor: No weakness.     Gait: Gait normal.   Psychiatric:        Mood and Affect: Mood normal.        Behavior: Behavior normal.      UC Treatments / Results  Labs (all labs ordered are listed, but only abnormal results are displayed) Labs Reviewed - No data to display  EKG   Radiology No results found.  Procedures ED EKG  Date/Time: 05/21/2024 3:41 PM  Performed by: Arvis Jolan NOVAK, PA-C Authorized by: Arvis Jolan NOVAK, PA-C   Previous ECG:    Previous ECG:  Unavailable Interpretation:    Interpretation: abnormal   Rate:    ECG rate:  93   ECG rate assessment: normal   Rhythm:    Rhythm: sinus rhythm   Ectopy:    Ectopy: none   QRS:    QRS axis:  Normal   QRS intervals:  Normal   QRS conduction: normal   ST segments:    ST segments:  Normal T waves:    T waves: non-specific   Comments:     Normal sinus rhythm.  Regular rate.  Nonspecific  T wave abnormality of lead II  (including critical care time)  Medications Ordered in UC Medications - No data to display  Initial Impression / Assessment and Plan / UC Course  I have reviewed the triage vital signs and the nursing notes.  Pertinent labs & imaging results that were available during my care of the patient were reviewed by me and considered in my medical decision making (see chart for details).   53 year old female presents for left upper back pain that began this morning.  Denies injury.  Denies chest pain, shortness of breath, sweats, numbness/tingling or weakness, palpitations.  Request to have an EKG and make sure everything is okay.  She has no history of heart attack or stroke and is a non-smoker but has history of hypertension and hyperlipidemia.  Patient is mildly tachycardic at 102 bpm.  Overall well-appearing.  No acute distress.  She has a distinct area of tenderness of the left lower parathoracic muscles.  Chest clear.  Heart regular rate rhythm.  EKG performed  today shows normal sinus rhythm and regular rate.  Nonspecific T wave abnormality of lead II.  Patient symptoms likely due to muscle strain and muscle spasm.  Advised heating pad, anti-inflammatory medication, muscle rubs, local massage.  Reviewed going to emergency department if she has associated chest pain, shortness of breath, sweats, dizziness, weakness or the pain acutely worsens or if she has pleuritic pain.  Patient is understanding and agreeable to plan.  Declines any prescriptions.   Final Clinical Impressions(s) / UC Diagnoses   Final diagnoses:  Acute left-sided thoracic back pain     Discharge Instructions      BACK PAIN: Stressed avoiding painful activities . RICE (REST, ICE, COMPRESSION, ELEVATION) guidelines reviewed. May alternate ice and heat. Consider use of muscle rubs, Salonpas patches, etc. Use medications as directed including muscle relaxers if prescribed. Take anti-inflammatory  medications as prescribed or OTC NSAIDs/Tylenol.  F/u with PCP in 7-10 days for reexamination, and please feel free to call or return to the urgent care at any time for any questions or concerns you may have and we will be happy to help you!   BACK PAIN RED FLAGS: If the back pain acutely worsens or there are any red flag symptoms such as numbness/tingling, leg weakness, saddle anesthesia, or loss of bowel/bladder control, go immediately to the ER. Follow up with us  as scheduled or sooner if the pain does not begin to resolve or if it worsens before the follow up     ED Prescriptions   None    PDMP not reviewed this encounter.   Arvis Jolan NOVAK, PA-C 05/21/24 [phone]

## 2024-05-24 ENCOUNTER — Encounter

## 2024-05-24 NOTE — Progress Notes (Deleted)
 There were no vitals taken for this visit.   Subjective:    Patient ID: Mackenzie Jones, female    DOB: September 12, 1971, 53 y.o.   MRN: [id number]  HPI: Mackenzie Jones is a 53 y.o. female  No chief complaint on file.   Discussed the use of AI scribe software for clinical note transcription with the patient, who gave verbal consent to proceed.  History of Present Illness          04/25/2024   11:09 AM 01/12/2024    2:27 PM 07/27/2023    7:50 AM  Depression screen PHQ 2/9  Decreased Interest 0 0 0  Down, Depressed, Hopeless 0 0 0  PHQ - 2 Score 0 0 0  Altered sleeping 0    Tired, decreased energy 0    Change in appetite 0    Feeling bad or failure about yourself  0    Trouble concentrating 0    Moving slowly or fidgety/restless 0    Suicidal thoughts 0    PHQ-9 Score 0    Difficult doing work/chores Not difficult at all      Relevant past medical, surgical, family and social history reviewed and updated as indicated. Interim medical history since our last visit reviewed. Allergies and medications reviewed and updated.  Review of Systems  Per HPI unless specifically indicated above     Objective:     There were no vitals taken for this visit.  {Vitals History (Optional):23777} Wt Readings from Last 3 Encounters:  05/21/24 170 lb (77.1 kg)  05/15/24 171 lb 14.4 oz (78 kg)  01/12/24 168 lb 3.2 oz (76.3 kg)    Physical Exam Physical Exam    Results for orders placed or performed in visit on 01/06/24  Lipid panel   Collection Time: 01/06/24 11:29 AM  Result Value Ref Range   Cholesterol 190 <200 mg/dL   HDL 63 > OR = 50 mg/dL   Triglycerides 84 <849 mg/dL   LDL Cholesterol (Calc) 110 (H) mg/dL (calc)   Total CHOL/HDL Ratio 3.0 <5.0 (calc)   Non-HDL Cholesterol (Calc) 127 <130 mg/dL (calc)  COMPLETE METABOLIC PANEL WITH GFR   Collection Time: 01/06/24 11:29 AM  Result Value Ref Range   Glucose, Bld 87 65 - 99 mg/dL   BUN 11 7 - 25 mg/dL   Creat 9.20 9.49 - 8.96  mg/dL   eGFR 89 > OR = 60 fO/fpw/8.26f7   BUN/Creatinine Ratio SEE NOTE: 6 - 22 (calc)   Sodium 140 135 - 146 mmol/L   Potassium 3.9 3.5 - 5.3 mmol/L   Chloride 103 98 - 110 mmol/L   CO2 23 20 - 32 mmol/L   Calcium  9.7 8.6 - 10.4 mg/dL   Total Protein 7.5 6.1 - 8.1 g/dL   Albumin 4.4 3.6 - 5.1 g/dL   Globulin 3.1 1.9 - 3.7 g/dL (calc)   AG Ratio 1.4 1.0 - 2.5 (calc)   Total Bilirubin 0.2 0.2 - 1.2 mg/dL   Alkaline phosphatase (APISO) 102 37 - 153 U/L   AST 16 10 - 35 U/L   ALT 15 6 - 29 U/L  TEST AUTHORIZATION   Collection Time: 01/06/24 11:29 AM  Result Value Ref Range   TEST NAME: COMPREHENSIVE METABOLIC    TEST CODE: [id number]    CLIENT CONTACT: Langston Summerfield    REPORT ALWAYS MESSAGE SIGNATURE     {Labs (Optional):23779}       Assessment & Plan:   Problem List Items Addressed This  Visit   None    Assessment and Plan Assessment & Plan         Follow up plan: No follow-ups on file.

## 2024-05-25 ENCOUNTER — Ambulatory Visit: Admitting: Nurse Practitioner

## 2024-05-29 ENCOUNTER — Ambulatory Visit
Admission: RE | Admit: 2024-05-29 | Discharge: 2024-05-29 | Disposition: A | Discharge status: Home / Self Care | Source: Ambulatory Visit | Attending: Nurse Practitioner | Admitting: Nurse Practitioner

## 2024-05-29 DIAGNOSIS — Z1231 Encounter for screening mammogram for malignant neoplasm of breast: Secondary | ICD-10-CM | POA: Diagnosis not present

## 2024-05-31 ENCOUNTER — Telehealth: Payer: Self-pay

## 2024-05-31 DIAGNOSIS — N951 Menopausal and female climacteric states: Secondary | ICD-10-CM

## 2024-05-31 MED ORDER — VEOZAH 45 MG PO TABS
45.0000 mg | ORAL_TABLET | Freq: Every day | ORAL | 11 refills | Status: DC
Start: 2024-05-31 — End: 2024-06-09

## 2024-05-31 NOTE — Telephone Encounter (Signed)
 Chart reviewed.  05/15/24 visit note:  Gave information on Veozah for hot flashes.      Damien Parsley, CNM [facility] with patient. Inquired about preferred pharmacy as we have [facility] on File, but she had requested [facility] on message. Patient state [facility] is fine. Rx sent. Patient aware this rx may require prior authorization. If not covered, patient also aware of saving card available online.

## 2024-05-31 NOTE — Telephone Encounter (Signed)
 TRIAGE VOICEMAIL: Patient states she saw Damien Slaughterbeck last week. She was given a new rx for estrogen and also recommended Veozah. Patient would like to try the [facility]. Requesting rx [facility]

## 2024-06-02 ENCOUNTER — Other Ambulatory Visit: Payer: Self-pay | Admitting: Nurse Practitioner

## 2024-06-02 DIAGNOSIS — E782 Mixed hyperlipidemia: Secondary | ICD-10-CM

## 2024-06-05 ENCOUNTER — Ambulatory Visit: Admitting: Nurse Practitioner

## 2024-06-06 NOTE — Telephone Encounter (Signed)
 Requested by interface surescripts. Last OV 04/25/24. Future visit in 1 month.  Requested Prescriptions  Pending Prescriptions Disp Refills   rosuvastatin  (CRESTOR ) 5 MG tablet [Pharmacy Med Name: ROSUVASTATIN  CALCIUM  5 MG TAB] 90 tablet 1    Sig: TAKE 1 TABLET BY MOUTH EVERYDAY AT BEDTIME     Cardiovascular:  Antilipid - Statins 2 Failed - 06/06/2024 11:05 AM      Failed - Valid encounter within last 12 months    Recent Outpatient Visits           1 month ago Essential hypertension   [facility] Gareth Mliss FALCON, FNP   7 years ago Calf pain, left   [facility] at [facility], [place], DO   8 years ago Obesity (BMI 30.0-34.9)   [facility], Natalie, DO   8 years ago Annual physical exam   [facility] at [facility], Natalie, DO   8 years ago Right arm pain   [facility] at [facility], Artist RAMAN, MD       Future Appointments             In 1 month Gareth, Mliss FALCON, FNP [facility]            Failed - Lipid Panel in normal range within the last 12 months    Cholesterol, Total  Date Value Ref Range Status  02/02/2017 256 (H) 100 - 199 mg/dL Final   Cholesterol  Date Value Ref Range Status  01/06/2024 190 <200 mg/dL Final   LDL Cholesterol (Calc)  Date Value Ref Range Status  01/06/2024 110 (H) mg/dL (calc) Final    Comment:    Reference range: <100 . Desirable range <100 mg/dL for primary prevention;   <70 mg/dL for patients with CHD or diabetic patients  with > or = 2 CHD risk factors. SABRA LDL-C is now calculated using the Martin-Hopkins  calculation, which is a validated novel method providing  better accuracy than the Friedewald equation in the  estimation of LDL-C.  Gladis APPLETHWAITE et al. SANDREA.  7986;689(80): 2061-2068  (http://education.[facility].com/faq/FAQ164)    HDL  Date Value Ref Range Status  01/06/2024 63 > OR = 50 mg/dL Final  96/93/7981 48 >60 mg/dL Final   Triglycerides  Date Value Ref Range Status  01/06/2024 84 <150 mg/dL Final         Passed - Cr in normal range and within 360 days    Creat  Date Value Ref Range Status  01/06/2024 0.79 0.50 - 1.03 mg/dL Final         Passed - Patient is not pregnant

## 2024-06-09 ENCOUNTER — Encounter: Payer: Self-pay | Admitting: Nurse Practitioner

## 2024-06-09 ENCOUNTER — Other Ambulatory Visit: Payer: Self-pay

## 2024-06-09 ENCOUNTER — Telehealth: Payer: Self-pay

## 2024-06-09 ENCOUNTER — Ambulatory Visit (INDEPENDENT_AMBULATORY_CARE_PROVIDER_SITE_OTHER): Admitting: Nurse Practitioner

## 2024-06-09 VITALS — BP 134/78 | HR 98 | Temp 97.8°F | Ht 60.0 in | Wt 171.1 lb

## 2024-06-09 DIAGNOSIS — N951 Menopausal and female climacteric states: Secondary | ICD-10-CM

## 2024-06-09 DIAGNOSIS — I10 Essential (primary) hypertension: Secondary | ICD-10-CM

## 2024-06-09 DIAGNOSIS — E559 Vitamin D deficiency, unspecified: Secondary | ICD-10-CM | POA: Diagnosis not present

## 2024-06-09 DIAGNOSIS — Z131 Encounter for screening for diabetes mellitus: Secondary | ICD-10-CM

## 2024-06-09 DIAGNOSIS — E782 Mixed hyperlipidemia: Secondary | ICD-10-CM

## 2024-06-09 MED ORDER — AMLODIPINE BESYLATE 5 MG PO TABS: 5.0000 mg | ORAL_TABLET | Freq: Every day | ORAL | 1 refills | Status: AC

## 2024-06-09 MED ORDER — VEOZAH 45 MG PO TABS: 45.0000 mg | ORAL_TABLET | Freq: Every day | ORAL | 11 refills | Status: DC

## 2024-06-09 NOTE — Telephone Encounter (Signed)
     Patients insurance company called and [facility] and no [facility] will be needed. Rx corrected and sent.

## 2024-06-09 NOTE — Progress Notes (Signed)
 BP 134/78 (BP Location: Left Arm, Patient Position: Sitting, Cuff Size: Normal)   Pulse 98   Temp 97.8 F (36.6 C) (Oral)   Ht 5' (1.524 m)   Wt 171 lb 1.6 oz (77.6 kg)   SpO2 99%   BMI 33.42 kg/m    Subjective:    Patient ID: Mackenzie Jones, female    DOB: July 05, 1971, 53 y.o.   MRN: [id number]  HPI: Mackenzie Jones is a 53 y.o. female  Chief Complaint  Patient presents with   Medical Management of Chronic Issues    HTN follow up, patient feels fine on amlodipine      Discussed the use of AI scribe software for clinical note transcription with the patient, who gave verbal consent to proceed.  History of Present Illness Mackenzie Jones is a 53 year old female who presents for a routine follow-up.  She has a history of hypertension and is taking amlodipine  5 mg daily.  She experiences vasomotor symptoms due to menopause. Initially managed with an estrogen patch 0.1 mg weekly and progesterone 100 mg daily, she began experiencing hot flashes again. She was prescribed Veozah  45 mg daily but has not started it yet due to awaiting insurance approval.  She has hyperlipidemia and is taking rosuvastatin  5 mg daily.  She has a history of vitamin D  deficiency.  She mentions a past episode of feeling 'off' with chest discomfort, which prompted an EKG that was normal. No further episodes have occurred since then.  No dizziness and she reports doing well overall.         06/09/2024   11:35 AM 04/25/2024   11:09 AM 01/12/2024    2:27 PM  Depression screen PHQ 2/9  Decreased Interest 0 0 0  Down, Depressed, Hopeless 0 0 0  PHQ - 2 Score 0 0 0  Altered sleeping 0 0   Tired, decreased energy 0 0   Change in appetite 0 0   Feeling bad or failure about yourself  0 0   Trouble concentrating 0 0   Moving slowly or fidgety/restless 0 0   Suicidal thoughts 0 0   PHQ-9 Score 0 0   Difficult doing work/chores Not difficult at all Not difficult at all     Relevant past medical, surgical, family  and social history reviewed and updated as indicated. Interim medical history since our last visit reviewed. Allergies and medications reviewed and updated.  Review of Systems  Constitutional: Negative for fever or weight change.  Respiratory: Negative for cough and shortness of breath.   Cardiovascular: Negative for chest pain or palpitations.  Gastrointestinal: Negative for abdominal pain, no bowel changes.  Musculoskeletal: Negative for gait problem or joint swelling.  Skin: Negative for rash.  Neurological: Negative for dizziness or headache.  No other specific complaints in a complete review of systems (except as listed in HPI above).      Objective:     BP 134/78 (BP Location: Left Arm, Patient Position: Sitting, Cuff Size: Normal)   Pulse 98   Temp 97.8 F (36.6 C) (Oral)   Ht 5' (1.524 m)   Wt 171 lb 1.6 oz (77.6 kg)   SpO2 99%   BMI 33.42 kg/m    Wt Readings from Last 3 Encounters:  06/09/24 171 lb 1.6 oz (77.6 kg)  05/21/24 170 lb (77.1 kg)  05/15/24 171 lb 14.4 oz (78 kg)    Physical Exam Physical Exam VITALS: BP- 134/78 GENERAL: Alert, cooperative, well developed, no acute distress. HEENT:  Normocephalic, normal oropharynx, moist mucous membranes. CHEST: Clear to auscultation bilaterally, no wheezes, rhonchi, or crackles. CARDIOVASCULAR: Normal heart rate and rhythm, S1 and S2 normal without murmurs. ABDOMEN: Soft, non-tender, non-distended, without organomegaly, normal bowel sounds. EXTREMITIES: No cyanosis or edema. NEUROLOGICAL: Cranial nerves grossly intact, moves all extremities without gross motor or sensory deficit.   Results for orders placed or performed in visit on 01/06/24  Lipid panel   Collection Time: 01/06/24 11:29 AM  Result Value Ref Range   Cholesterol 190 <200 mg/dL   HDL 63 > OR = 50 mg/dL   Triglycerides 84 <849 mg/dL   LDL Cholesterol (Calc) 110 (H) mg/dL (calc)   Total CHOL/HDL Ratio 3.0 <5.0 (calc)   Non-HDL Cholesterol (Calc)  127 <130 mg/dL (calc)  COMPLETE METABOLIC PANEL WITH GFR   Collection Time: 01/06/24 11:29 AM  Result Value Ref Range   Glucose, Bld 87 65 - 99 mg/dL   BUN 11 7 - 25 mg/dL   Creat 9.20 9.49 - 8.96 mg/dL   eGFR 89 > OR = 60 fO/fpw/8.26f7   BUN/Creatinine Ratio SEE NOTE: 6 - 22 (calc)   Sodium 140 135 - 146 mmol/L   Potassium 3.9 3.5 - 5.3 mmol/L   Chloride 103 98 - 110 mmol/L   CO2 23 20 - 32 mmol/L   Calcium  9.7 8.6 - 10.4 mg/dL   Total Protein 7.5 6.1 - 8.1 g/dL   Albumin 4.4 3.6 - 5.1 g/dL   Globulin 3.1 1.9 - 3.7 g/dL (calc)   AG Ratio 1.4 1.0 - 2.5 (calc)   Total Bilirubin 0.2 0.2 - 1.2 mg/dL   Alkaline phosphatase (APISO) 102 37 - 153 U/L   AST 16 10 - 35 U/L   ALT 15 6 - 29 U/L  TEST AUTHORIZATION   Collection Time: 01/06/24 11:29 AM  Result Value Ref Range   TEST NAME: COMPREHENSIVE METABOLIC    TEST CODE: [id number]    CLIENT CONTACT: Mackenzie Jones    REPORT ALWAYS MESSAGE SIGNATURE            Assessment & Plan:   Problem List Items Addressed This Visit       Cardiovascular and Mediastinum   Essential hypertension - Primary   Relevant Medications   amLODipine  (NORVASC ) 5 MG tablet   Hot flashes due to menopause   Relevant Medications   amLODipine  (NORVASC ) 5 MG tablet     Other   Vitamin D  deficiency   Mixed hyperlipidemia   Relevant Medications   amLODipine  (NORVASC ) 5 MG tablet   Other Visit Diagnoses       Screening for diabetes mellitus            Assessment and Plan Assessment & Plan Vasomotor symptoms due to menopause Experiences vasomotor symptoms, including hot flashes, initially well-managed with estrogen patch and progesterone. Symptoms have recurred, and she is awaiting insurance approval for Veozah , a newer medication prescribed by her OB GYN. - Await insurance approval for Veozah  - Monitor response to Veozah  once started  Hypertension Blood pressure is well-controlled on current medication regimen. Current reading is 134/78 mmHg,  indicating good management. - Refill amlodipine  5 mg daily  Hyperlipidemia Condition is managed with rosuvastatin  5 mg daily. No new issues reported.  Vitamin D  deficiency Plan to recheck levels. - Recheck vitamin D  levels at next appointment        Follow up plan: Return in about 6 months (around 12/10/2024) for follow up.

## 2024-07-11 ENCOUNTER — Other Ambulatory Visit: Payer: Self-pay

## 2024-07-11 MED ORDER — VALACYCLOVIR HCL 500 MG PO TABS: 500.0000 mg | ORAL_TABLET | Freq: Every day | ORAL | 3 refills | Status: AC | PRN

## 2024-07-27 NOTE — Progress Notes (Unsigned)
 Name: Mackenzie Jones   MRN: [id number]    DOB: August 14, 1971   Date:07/28/2024       Progress Note  Subjective  Chief Complaint  Patient reports today for annual well exam as well as concerns for weight gain, hot flashes, and bilateral foot pain in the morning that resolves on its own.   Foot Pain: -Reports bilateral foot pain upon awakening in the morning but resolves upon ambulating   Menopausal Sx: -Reports hot flashes  -Denies decrease in energy level/fatigue   Weight gain: -Patient reports recent weight gain due to life change (change in job) -Reports increase in fast food and not watching her calorie intake  -Reports interest in restarting Zepbound   -Weight today is 176 lb -Waist: 39 inches   HPI  Patient presents for annual CPE on 07/27/2024  Diet: Reports mostly fast food during the work day and at night  Exercise: 5 days 10,000 steps  Sleep: 7 hours Last dental exam:completed Last eye exam: completed  Flowsheet Row Office Visit from 07/28/2024 in [facility]  AUDIT-C Score 0   Depression: Phq 9 is  negative    07/28/2024    7:47 AM 06/09/2024   11:35 AM 04/25/2024   11:09 AM 01/12/2024    2:27 PM 07/27/2023    7:50 AM  Depression screen PHQ 2/9  Decreased Interest 0 0 0 0 0  Down, Depressed, Hopeless 0 0 0 0 0  PHQ - 2 Score 0 0 0 0 0  Altered sleeping  0 0    Tired, decreased energy  0 0    Change in appetite  0 0    Feeling bad or failure about yourself   0 0    Trouble concentrating  0 0    Moving slowly or fidgety/restless  0 0    Suicidal thoughts  0 0    PHQ-9 Score  0 0    Difficult doing work/chores  Not difficult at all Not difficult at all     Hypertension: BP Readings from Last 3 Encounters:  07/28/24 124/76  06/09/24 134/78  05/21/24 135/81   Obesity: Wt Readings from Last 3 Encounters:  07/28/24 176 lb 8 oz (80.1 kg)  06/09/24 171 lb 1.6 oz (77.6 kg)  05/21/24 170 lb (77.1 kg)   BMI Readings from Last 3  Encounters:  07/28/24 34.47 kg/m  06/09/24 33.42 kg/m  05/21/24 33.20 kg/m     Vaccines:  HPV: up to at age 36 , ask insurance if age between 98-45  Shingrix: 12-64 yo and ask insurance if covered when patient above 26 yo Pneumonia:  educated and discussed with patient. Flu:  educated and discussed with patient.  Hep C Screening: 07/24/2022 STD testing and prevention (HIV/chl/gon/syphilis): 07/24/2022 Intimate partner violence:NA Sexual History: Sexually active with one partner  Menstrual History/LMP/Abnormal Bleeding: menopause  Incontinence Symptoms: none   Breast cancer:  - Last Mammogram: 05/29/2024 (unremarkable)  - BRCA gene screening: none  Osteoporosis: Discussed high calcium  and vitamin D  supplementation, weight bearing exercises  Cervical cancer screening: 08/26/2022  Skin cancer: Discussed monitoring for atypical lesions  Colorectal cancer: 03/04/2023   Lung cancer:  Does not qualify Low Dose CT Chest recommended if Age 24-80 years, 20 pack-year currently smoking OR have quit w/in 15years. Patient does not qualify.   ECG: 05/21/2024  Advanced Care Planning: A voluntary discussion about advance care planning including the explanation and discussion of advance directives.  Discussed health care proxy and Living will, and  the patient was able to identify a health care proxy as her husband.  Patient does have a living will at present time. If patient does have living will, I have requested they bring this to the clinic to be scanned in to their chart.  Lipids: Lab Results  Component Value Date   CHOL 190 01/06/2024   CHOL 191 07/27/2023   CHOL 223 (H) 01/25/2023   Lab Results  Component Value Date   HDL 63 01/06/2024   HDL 60 07/27/2023   HDL 46 (L) 01/25/2023   Lab Results  Component Value Date   LDLCALC 110 (H) 01/06/2024   LDLCALC 108 (H) 07/27/2023   LDLCALC 155 (H) 01/25/2023   Lab Results  Component Value Date   TRIG 84 01/06/2024   TRIG 118  07/27/2023   TRIG 105 01/25/2023   Lab Results  Component Value Date   CHOLHDL 3.0 01/06/2024   CHOLHDL 3.2 07/27/2023   CHOLHDL 4.8 01/25/2023   No results found for: LDLDIRECT  Glucose: Glucose, Bld  Date Value Ref Range Status  01/06/2024 87 65 - 99 mg/dL Final    Comment:    .            Fasting reference interval .   07/27/2023 106 (H) 65 - 99 mg/dL Final    Comment:    .            Fasting reference interval . For someone without known diabetes, a glucose value between 100 and 125 mg/dL is consistent with prediabetes and should be confirmed with a follow-up test. .   01/25/2023 86 65 - 99 mg/dL Final    Comment:    .            Fasting reference interval .     Patient Active Problem List   Diagnosis Date Noted   Hot flashes due to menopause 05/15/2024   Adenomatous polyp of colon 03/04/2023   Overweight 01/25/2023   Anal fissure 07/24/2022   Anorectal disorder 07/24/2022   Bleeding per rectum 07/24/2022   Flatulence, eructation and gas pain 07/24/2022   Hemorrhoids 07/24/2022   Screening for colon cancer 07/24/2022   Essential hypertension 11/17/2019   Mixed hyperlipidemia 11/17/2019   Fibroma 01/19/2019   Family history of protein S deficiency 10/19/2017   Vitamin D  deficiency 01/18/2017   History of PCR DNA positive for HSV2    Right arm pain 10/01/2015    Past Surgical History:  Procedure Laterality Date   ABLATION     COLONOSCOPY WITH PROPOFOL  N/A 03/04/2023   Procedure: COLONOSCOPY WITH PROPOFOL ;  Surgeon: Therisa Bi, MD;  Location: [facility] ENDOSCOPY;  Service: Gastroenterology;  Laterality: N/A;   lipo sucttion  2013   WISDOM TOOTH EXTRACTION  1996    Family History  Problem Relation Age of Onset   Hypertension Mother    Hyperlipidemia Mother    Protein S deficiency Father    Stroke Maternal Grandmother    Heart disease Maternal Grandfather    Arthritis Paternal Grandmother    Hyperlipidemia Sister    Breast cancer Neg Hx      Social History   Socioeconomic History   Marital status: Married    Spouse name: brian   Number of children: 2   Years of education: Masters   Highest education level: Master's degree (e.g., MA, MS, MEng, MEd, MSW, MBA)  Occupational History   Occupation: Art gallery manager  Tobacco Use   Smoking status: Never   Smokeless tobacco: Never  Vaping Use   Vaping status: Never Used  Substance and Sexual Activity   Alcohol use: No   Drug use: No   Sexual activity: Yes    Partners: Male    Birth control/protection: Surgical    Comment: vas  Other Topics Concern   Not on file  Social History Narrative   Married to [place]. They have 2 daughters Jolynn and [place]).   Master's in Public relations account executive. Works full time.   Drinks caffeine, takes a daily vitamin.    Exercises routinely.   Smoke detector in the home.   Firearms locked in the home.    Feels safe in her relationships.    Works at [employer]: Low Risk  (07/28/2024)   Overall Financial Resource Strain (CARDIA)    Difficulty of Paying Living Expenses: Not hard at all  Food Insecurity: No Food Insecurity (07/28/2024)   Hunger Vital Sign    Worried About Running Out of Food in the Last Year: Never true    Ran Out of Food in the Last Year: Never true  Transportation Needs: No Transportation Needs (07/28/2024)   PRAPARE - Administrator, Civil Service (Medical): No    Lack of Transportation (Non-Medical): No  Physical Activity: Sufficiently Active (07/28/2024)   Exercise Vital Sign    Days of Exercise per Week: 5 days    Minutes of Exercise per Session: 30 min  Stress: No Stress Concern Present (07/28/2024)   [employer] of Occupational Health - Occupational Stress Questionnaire    Feeling of Stress: Not at all  Social Connections: Socially Integrated (07/28/2024)   Social Connection and Isolation Panel    Frequency of Communication with Friends and Family: More than  three times a week    Frequency of Social Gatherings with Friends and Family: More than three times a week    Attends Religious Services: 1 to 4 times per year    Active Member of [employer] or Organizations: No    Attends Engineer, structural: More than 4 times per year    Marital Status: Married  Catering manager Violence: Not At Risk (07/27/2023)   Humiliation, Afraid, Rape, and Kick questionnaire    Fear of Current or Ex-Partner: No    Emotionally Abused: No    Physically Abused: No    Sexually Abused: No     Current Outpatient Medications:    amLODipine  (NORVASC ) 5 MG tablet, Take 1 tablet (5 mg total) by mouth daily., Disp: 90 tablet, Rfl: 1   clobetasol (TEMOVATE) 0.05 % external solution, Apply topically 2 (two) times daily., Disp: , Rfl:    estradiol  (CLIMARA ) 0.1 mg/24hr patch, Place 1 patch (0.1 mg total) onto the skin once a week., Disp: 12 patch, Rfl: 3   Fezolinetant  (VEOZAH ) 45 MG TABS, Take 1 tablet (45 mg total) by mouth daily., Disp: 30 tablet, Rfl: 11   progesterone (PROMETRIUM) 100 MG capsule, Take 1 tablet by mouth daily., Disp: , Rfl:    rosuvastatin  (CRESTOR ) 5 MG tablet, TAKE 1 TABLET BY MOUTH EVERYDAY AT BEDTIME, Disp: 90 tablet, Rfl: 1   tirzepatide  (ZEPBOUND ) 2.5 MG/0.5ML Pen, Inject 2.5 mg into the skin once a week., Disp: 2 mL, Rfl: 0   valACYclovir  (VALTREX ) 500 MG tablet, Take 1 tablet (500 mg total) by mouth daily as needed. PRN, Disp: 90 tablet, Rfl: 3  Allergies  Allergen Reactions   Prednisone Hives   Flagyl [Metronidazole]  Sulfa Antibiotics      ROS  Constitutional: Negative for fever or weight change.  Respiratory: Negative for cough and shortness of breath.   Cardiovascular: Negative for chest pain or palpitations.  Gastrointestinal: Negative for abdominal pain, no bowel changes.  Musculoskeletal: Negative for gait problem or joint swelling.  Skin: Negative for rash.  Neurological: Negative for dizziness or headache.  No other  specific complaints in a complete review of systems (except as listed in HPI above).   Objective  Vitals:   07/28/24 0744  BP: 124/76  Pulse: 92  Resp: 16  Temp: 97.8 F (36.6 C)  TempSrc: Oral  SpO2: 99%  Weight: 176 lb 8 oz (80.1 kg)  Height: 5' (1.524 m)    Body mass index is 34.47 kg/m.  Physical Exam Constitutional:      Appearance: Normal appearance.  HENT:     Head: Normocephalic and atraumatic.     Right Ear: Tympanic membrane normal.     Left Ear: Tympanic membrane normal.     Nose: Nose normal.     Mouth/Throat:     Mouth: Mucous membranes are moist.  Eyes:     Pupils: Pupils are equal, round, and reactive to light.  Cardiovascular:     Rate and Rhythm: Normal rate and regular rhythm.     Pulses: Normal pulses.     Heart sounds: Normal heart sounds.  Pulmonary:     Effort: Pulmonary effort is normal.     Breath sounds: Normal breath sounds.  Abdominal:     General: Abdomen is flat. Bowel sounds are normal.     Palpations: Abdomen is soft.  Musculoskeletal:        General: Normal range of motion.     Cervical back: Normal range of motion and neck supple.  Skin:    General: Skin is warm and dry.  Neurological:     General: No focal deficit present.     Mental Status: She is alert and oriented to person, place, and time.  Psychiatric:        Mood and Affect: Mood normal.        Behavior: Behavior normal.        Thought Content: Thought content normal.        Judgment: Judgment normal.    Waist Measurement : 39 inches     No results found for this or any previous visit (from the past 2160 hours).      Fall Risk:    07/28/2024    7:47 AM 06/09/2024   11:35 AM 04/25/2024   11:08 AM 01/12/2024    2:27 PM 07/27/2023    7:49 AM  Fall Risk   Falls in the past year? 0 0 0 0 0  Number falls in past yr: 0  0 0 0  Injury with Fall? 0  0 0 0  Risk for fall due to : No Fall Risks No Fall Risks No Fall Risks No Fall Risks   Follow up Falls  evaluation completed Falls evaluation completed Falls evaluation completed Falls evaluation completed     Functional Status Survey: Is the patient deaf or have difficulty hearing?: No Does the patient have difficulty seeing, even when wearing glasses/contacts?: No Does the patient have difficulty concentrating, remembering, or making decisions?: No Does the patient have difficulty walking or climbing stairs?: No Does the patient have difficulty dressing or bathing?: No Does the patient have difficulty doing errands alone such as visiting a doctor's office or  shopping?: No   Assessment & Plan  Problem List Items Addressed This Visit       Cardiovascular and Mediastinum   Hot flashes due to menopause   Continue using the estradiol  patches.         Other   Mixed hyperlipidemia   Relevant Orders   Lipid panel   Comprehensive metabolic panel with GFR   Overweight   Reports recent weight gain. Restarting patient on Zepbound . Advised on lifestyle modifications including diet and exercise. Medication sent to pharmacy. Will follow-up with patient in 3 months.       Other Visit Diagnoses       Screening for diabetes mellitus    -  Primary   checking A1C   Relevant Orders   Hemoglobin A1c   Comprehensive metabolic panel with GFR     Screening for cholesterol level       checking lipid panel   Relevant Orders   Lipid panel     Screening for deficiency anemia       Relevant Orders   CBC with Differential/Platelet     Annual physical exam       Checking routine labs   Relevant Orders   Lipid panel   Hemoglobin A1c   CBC with Differential/Platelet   Comprehensive metabolic panel with GFR   TSH     Class 1 obesity due to excess calories with serious comorbidity and body mass index (BMI) of 34.0 to 34.9 in adult       Restarting the Zepbound . Advised about lifestyle modifications including diet and exercise.   Relevant Medications   tirzepatide  (ZEPBOUND ) 2.5 MG/0.5ML Pen    Other Relevant Orders   TSH     Bilateral foot pain       Reports foot pain only in morning upon awaking. Recommend heat therapy and posssible referral to ortho if worsening.       Annual exam -up to date on pap, colonoscopy -labs ordered  Hot flashes: -Continue using the estradiol  patches   Bilateral Foot Pain: sounds like plantar fasciitis  -NSAIDs for pain relief  -can roll foot on frozen water bottle,   -Possible referral to ortho if worsening   Weight Management: -Start taking the Zepbound  2.5mg   -Track calories and reduce fast food intake  -150 min/week of exercise  -provided note to recommend weight watchers  -USPSTF grade A and B recommendations reviewed with patient; age-appropriate recommendations, preventive care, screening tests, etc discussed and encouraged; healthy living encouraged; see AVS for patient education given to patient -Discussed importance of 150 minutes of physical activity weekly, eat two servings of fish weekly, eat one serving of tree nuts ( cashews, pistachios, pecans, almonds.SABRA) every other day, eat 6 servings of fruit/vegetables daily and drink plenty of water and avoid sweet beverages.   -Reviewed Health Maintenance: YES  I have reviewed this encounter including the documentation in this note and/or discussed this patient with the provider, Fraida Veldman, SNP, I am certifying that I agree with the content of this note as supervising/preceptor nurse practitioner.  Mliss Spray, FNP-C [facility] 07/28/2024, 8:34 AM

## 2024-07-27 NOTE — Progress Notes (Deleted)
 Name: Mackenzie Jones   MRN: [id number]    DOB: [birth year]   Date:07/27/2024       Progress Note  Subjective  Chief Complaint  No chief complaint on file.   HPI  Patient presents for annual CPE ***.    Diet: *** Exercise: *** Sleep: *** Last dental exam:*** Last eye exam: ***  Depression: phq 9 is {gen pos neg:315643}    06/09/2024   11:35 AM 04/25/2024   11:09 AM 01/12/2024    2:27 PM 07/27/2023    7:50 AM 03/24/2023    8:37 AM  Depression screen PHQ 2/9  Decreased Interest 0 0 0 0 0  Down, Depressed, Hopeless 0 0 0 0 0  PHQ - 2 Score 0 0 0 0 0  Altered sleeping 0 0     Tired, decreased energy 0 0     Change in appetite 0 0     Feeling bad or failure about yourself  0 0     Trouble concentrating 0 0     Moving slowly or fidgety/restless 0 0     Suicidal thoughts 0 0     PHQ-9 Score 0 0     Difficult doing work/chores Not difficult at all Not difficult at all       Hypertension:  BP Readings from Last 3 Encounters:  06/09/24 134/78  05/21/24 135/81  05/15/24 124/72    Obesity: Wt Readings from Last 3 Encounters:  06/09/24 171 lb 1.6 oz (77.6 kg)  05/21/24 170 lb (77.1 kg)  05/15/24 171 lb 14.4 oz (78 kg)   BMI Readings from Last 3 Encounters:  06/09/24 33.42 kg/m  05/21/24 33.20 kg/m  05/15/24 33.57 kg/m     Lipids:  Lab Results  Component Value Date   CHOL 190 01/06/2024   CHOL 191 07/27/2023   CHOL 223 (H) 01/25/2023   Lab Results  Component Value Date   HDL 63 01/06/2024   HDL 60 07/27/2023   HDL 46 (L) 01/25/2023   Lab Results  Component Value Date   LDLCALC 110 (H) 01/06/2024   LDLCALC 108 (H) 07/27/2023   LDLCALC 155 (H) 01/25/2023   Lab Results  Component Value Date   TRIG 84 01/06/2024   TRIG 118 07/27/2023   TRIG 105 01/25/2023   Lab Results  Component Value Date   CHOLHDL 3.0 01/06/2024   CHOLHDL 3.2 07/27/2023   CHOLHDL 4.8 01/25/2023   No results found for: LDLDIRECT Glucose:  Glucose, Bld  Date Value Ref Range  Status  01/06/2024 87 65 - 99 mg/dL Final    Comment:    .            Fasting reference interval .   07/27/2023 106 (H) 65 - 99 mg/dL Final    Comment:    .            Fasting reference interval . For someone without known diabetes, a glucose value between 100 and 125 mg/dL is consistent with prediabetes and should be confirmed with a follow-up test. .   01/25/2023 86 65 - 99 mg/dL Final    Comment:    .            Fasting reference interval .     Flowsheet Row Office Visit from 04/25/2024 in [facility]  AUDIT-C Score 1    ***  Married STD testing and prevention (HIV/chl/gon/syphilis):  Hep C: ***  Skin cancer: Discussed monitoring for atypical lesions Colorectal cancer: ***  Prostate cancer: *** No results found for: PSA   Lung cancer:  *** Low Dose CT Chest recommended if Age 21-80 years, 30 pack-year currently smoking OR have quit w/in 15years. Patient {DOES NOT does:27190::does not} qualify.   AAA: *** The USPSTF recommends one-time screening with ultrasonography in men ages 5 to 77 years who have ever smoked ECG:  ***  Vaccines:  HPV: up to at age 72 , ask insurance if age between 52-45  Shingrix: 38-64 yo and ask insurance if covered when patient above 57 yo Pneumonia: *** educated and discussed with patient. Flu: *** educated and discussed with patient.  Advanced Care Planning: A voluntary discussion about advance care planning including the explanation and discussion of advance directives.  Discussed health care proxy and Living will, and the patient was able to identify a health care proxy as ***.  Patient {DOES_DOES WNU:81435} have a living will at present time. If patient does have living will, I have requested they bring this to the clinic to be scanned in to their chart.  Patient Active Problem List   Diagnosis Date Noted   Hot flashes due to menopause 05/15/2024   Adenomatous polyp of colon 03/04/2023   Overweight  01/25/2023   Anal fissure 07/24/2022   Anorectal disorder 07/24/2022   Bleeding per rectum 07/24/2022   Flatulence, eructation and gas pain 07/24/2022   Hemorrhoids 07/24/2022   Screening for colon cancer 07/24/2022   Essential hypertension 11/17/2019   Mixed hyperlipidemia 11/17/2019   Fibroma 01/19/2019   Family history of protein S deficiency 10/19/2017   Vitamin D  deficiency 01/18/2017   History of PCR DNA positive for HSV2    Right arm pain 10/01/2015    Past Surgical History:  Procedure Laterality Date   ABLATION     COLONOSCOPY WITH PROPOFOL  N/A 03/04/2023   Procedure: COLONOSCOPY WITH PROPOFOL ;  Surgeon: Therisa Bi, MD;  Location: [facility];  Service: Gastroenterology;  Laterality: N/A;   lipo sucttion  2013   WISDOM TOOTH EXTRACTION  1996    Family History  Problem Relation Age of Onset   Hypertension Mother    Hyperlipidemia Mother    Protein S deficiency Father    Stroke Maternal Grandmother    Heart disease Maternal Grandfather    Arthritis Paternal Grandmother    Hyperlipidemia Sister    Breast cancer Neg Hx     Social History   Socioeconomic History   Marital status: Married    Spouse name: brian   Number of children: 2   Years of education: Masters   Highest education level: Master's degree (e.g., MA, MS, MEng, MEd, MSW, MBA)  Occupational History   Occupation: Art gallery manager  Tobacco Use   Smoking status: Never   Smokeless tobacco: Never  Vaping Use   Vaping status: Never Used  Substance and Sexual Activity   Alcohol use: No   Drug use: No   Sexual activity: Yes    Partners: Male    Birth control/protection: Surgical    Comment: vas  Other Topics Concern   Not on file  Social History Narrative   Married to [place]. They have 2 daughters Jolynn and [place]).   Master's in Public relations account executive. Works full time.   Drinks caffeine, takes a daily vitamin.    Exercises routinely.   Smoke detector in the home.   Firearms locked in the home.    Feels safe  in her relationships.    Works at [employer]  Financial Resource Strain: Low Risk  (04/25/2024)   Overall Financial Resource Strain (CARDIA)    Difficulty of Paying Living Expenses: Not hard at all  Food Insecurity: No Food Insecurity (04/25/2024)   Hunger Vital Sign    Worried About Running Out of Food in the Last Year: Never true    Ran Out of Food in the Last Year: Never true  Transportation Needs: No Transportation Needs (04/25/2024)   PRAPARE - Administrator, Civil Service (Medical): No    Lack of Transportation (Non-Medical): No  Physical Activity: Insufficiently Active (04/25/2024)   Exercise Vital Sign    Days of Exercise per Week: 1 day    Minutes of Exercise per Session: 10 min  Stress: No Stress Concern Present (04/25/2024)   [employer] of Occupational Health - Occupational Stress Questionnaire    Feeling of Stress : Not at all  Social Connections: Socially Integrated (04/25/2024)   Social Connection and Isolation Panel    Frequency of Communication with Friends and Family: Three times a week    Frequency of Social Gatherings with Friends and Family: Once a week    Attends Religious Services: 1 to 4 times per year    Active Member of [employer] or Organizations: No    Attends Engineer, structural: More than 4 times per year    Marital Status: Married  Catering manager Violence: Not At Risk (07/27/2023)   Humiliation, Afraid, Rape, and Kick questionnaire    Fear of Current or Ex-Partner: No    Emotionally Abused: No    Physically Abused: No    Sexually Abused: No     Current Outpatient Medications:    amLODipine  (NORVASC ) 5 MG tablet, Take 1 tablet (5 mg total) by mouth daily., Disp: 90 tablet, Rfl: 1   clobetasol (TEMOVATE) 0.05 % external solution, Apply topically 2 (two) times daily., Disp: , Rfl:    estradiol  (CLIMARA ) 0.1 mg/24hr patch, Place 1 patch (0.1 mg total) onto the skin once a week., Disp: 12 patch, Rfl:  3   Fezolinetant  (VEOZAH ) 45 MG TABS, Take 1 tablet (45 mg total) by mouth daily., Disp: 30 tablet, Rfl: 11   progesterone (PROMETRIUM) 100 MG capsule, Take 1 tablet by mouth daily., Disp: , Rfl:    rosuvastatin  (CRESTOR ) 5 MG tablet, TAKE 1 TABLET BY MOUTH EVERYDAY AT BEDTIME, Disp: 90 tablet, Rfl: 1   valACYclovir  (VALTREX ) 500 MG tablet, Take 1 tablet (500 mg total) by mouth daily as needed. PRN, Disp: 90 tablet, Rfl: 3  Allergies  Allergen Reactions   Prednisone Hives   Flagyl [Metronidazole]    Sulfa Antibiotics      ROS  ***   Objective  There were no vitals filed for this visit.  There is no height or weight on file to calculate BMI.  Physical Exam ***  No results found for this or any previous visit (from the past 2160 hours).   Fall Risk:    06/09/2024   11:35 AM 04/25/2024   11:08 AM 01/12/2024    2:27 PM 07/27/2023    7:49 AM 03/24/2023    8:37 AM  Fall Risk   Falls in the past year? 0 0 0 0 0  Number falls in past yr:  0 0 0 0  Injury with Fall?  0 0 0 0  Risk for fall due to : No Fall Risks No Fall Risks No Fall Risks    Follow up Falls evaluation completed Falls evaluation completed Falls  evaluation completed     ***   Functional Status Survey:   ***   Assessment & Plan  There are no diagnoses linked to this encounter.   -Prostate cancer screening and PSA options (with potential risks and benefits of testing vs not testing) were discussed along with recent recs/guidelines. -USPSTF grade A and B recommendations reviewed with patient; age-appropriate recommendations, preventive care, screening tests, etc discussed and encouraged; healthy living encouraged; see AVS for patient education given to patient -Discussed importance of 150 minutes of physical activity weekly, eat two servings of fish weekly, eat one serving of tree nuts ( cashews, pistachios, pecans, almonds.SABRA) every other day, eat 6 servings of fruit/vegetables daily and drink plenty of  water and avoid sweet beverages.  -Reviewed Health Maintenance: ***

## 2024-07-28 ENCOUNTER — Other Ambulatory Visit: Payer: Self-pay

## 2024-07-28 ENCOUNTER — Ambulatory Visit (INDEPENDENT_AMBULATORY_CARE_PROVIDER_SITE_OTHER): Payer: Self-pay | Admitting: Nurse Practitioner

## 2024-07-28 ENCOUNTER — Encounter: Payer: Self-pay | Admitting: Nurse Practitioner

## 2024-07-28 VITALS — BP 124/76 | HR 92 | Temp 97.8°F | Resp 16 | Ht 60.0 in | Wt 176.5 lb

## 2024-07-28 DIAGNOSIS — Z0000 Encounter for general adult medical examination without abnormal findings: Secondary | ICD-10-CM

## 2024-07-28 DIAGNOSIS — Z13220 Encounter for screening for lipoid disorders: Secondary | ICD-10-CM | POA: Diagnosis not present

## 2024-07-28 DIAGNOSIS — M79671 Pain in right foot: Secondary | ICD-10-CM | POA: Diagnosis not present

## 2024-07-28 DIAGNOSIS — Z130 Encounter for screening for diseases of the blood and blood-forming organs and certain disorders involving the immune mechanism: Secondary | ICD-10-CM

## 2024-07-28 DIAGNOSIS — Z6834 Body mass index (BMI) 34.0-34.9, adult: Secondary | ICD-10-CM

## 2024-07-28 DIAGNOSIS — E782 Mixed hyperlipidemia: Secondary | ICD-10-CM | POA: Diagnosis not present

## 2024-07-28 DIAGNOSIS — E663 Overweight: Secondary | ICD-10-CM | POA: Diagnosis not present

## 2024-07-28 DIAGNOSIS — N951 Menopausal and female climacteric states: Secondary | ICD-10-CM | POA: Diagnosis not present

## 2024-07-28 DIAGNOSIS — E6609 Other obesity due to excess calories: Secondary | ICD-10-CM

## 2024-07-28 DIAGNOSIS — E66811 Obesity, class 1: Secondary | ICD-10-CM

## 2024-07-28 DIAGNOSIS — M79672 Pain in left foot: Secondary | ICD-10-CM

## 2024-07-28 DIAGNOSIS — Z131 Encounter for screening for diabetes mellitus: Secondary | ICD-10-CM

## 2024-07-28 MED ORDER — ZEPBOUND 2.5 MG/0.5ML ~~LOC~~ SOAJ: 2.5000 mg | SUBCUTANEOUS | 0 refills | Status: DC

## 2024-07-28 NOTE — Assessment & Plan Note (Addendum)
 Reports recent weight gain. Restarting patient on Zepbound . Advised on lifestyle modifications including diet and exercise. Medication sent to pharmacy. Will follow-up with patient in 3 months.

## 2024-07-28 NOTE — Assessment & Plan Note (Signed)
 Continue using the estradiol  patches.

## 2024-07-29 LAB — CBC WITH DIFFERENTIAL/PLATELET
Absolute Lymphocytes: 3096 {cells}/uL (ref 850–3900)
Absolute Monocytes: 454 {cells}/uL (ref 200–950)
Basophils Absolute: 43 {cells}/uL (ref 0–200)
Basophils Relative: 0.6 %
Eosinophils Absolute: 151 {cells}/uL (ref 15–500)
Eosinophils Relative: 2.1 %
HCT: 36.7 % (ref 35.0–45.0)
Hemoglobin: 12.1 g/dL (ref 11.7–15.5)
MCH: 29.2 pg (ref 27.0–33.0)
MCHC: 33 g/dL (ref 32.0–36.0)
MCV: 88.6 fL (ref 80.0–100.0)
MPV: 10.5 fL (ref 7.5–12.5)
Monocytes Relative: 6.3 %
Neutro Abs: 3456 {cells}/uL (ref 1500–7800)
Neutrophils Relative %: 48 %
Platelets: 372 Thousand/uL (ref 140–400)
RBC: 4.14 Million/uL (ref 3.80–5.10)
RDW: 12.5 % (ref 11.0–15.0)
Total Lymphocyte: 43 %
WBC: 7.2 Thousand/uL (ref 3.8–10.8)

## 2024-07-29 LAB — COMPREHENSIVE METABOLIC PANEL WITH GFR
AG Ratio: 1.8 (calc) (ref 1.0–2.5)
ALT: 19 U/L (ref 6–29)
AST: 15 U/L (ref 10–35)
Albumin: 4.6 g/dL (ref 3.6–5.1)
Alkaline phosphatase (APISO): 109 U/L (ref 37–153)
BUN: 18 mg/dL (ref 7–25)
CO2: 28 mmol/L (ref 20–32)
Calcium: 9.6 mg/dL (ref 8.6–10.4)
Chloride: 104 mmol/L (ref 98–110)
Creat: 0.74 mg/dL (ref 0.50–1.03)
Globulin: 2.6 g/dL (ref 1.9–3.7)
Glucose, Bld: 101 mg/dL — ABNORMAL HIGH (ref 65–99)
Potassium: 4.3 mmol/L (ref 3.5–5.3)
Sodium: 139 mmol/L (ref 135–146)
Total Bilirubin: 0.5 mg/dL (ref 0.2–1.2)
Total Protein: 7.2 g/dL (ref 6.1–8.1)
eGFR: 96 mL/min/1.73m2 (ref >=60)

## 2024-07-29 LAB — LIPID PANEL
Cholesterol: 185 mg/dL (ref <200)
HDL: 53 mg/dL (ref >=50)
LDL Cholesterol (Calc): 111 mg/dL — ABNORMAL HIGH
Non-HDL Cholesterol (Calc): 132 mg/dL — ABNORMAL HIGH (ref <130)
Total CHOL/HDL Ratio: 3.5 (calc) (ref <5.0)
Triglycerides: 99 mg/dL (ref <150)

## 2024-07-29 LAB — HEMOGLOBIN A1C
Hgb A1c MFr Bld: 5.5 % (ref <5.7)
Mean Plasma Glucose: 111 mg/dL
eAG (mmol/L): 6.2 mmol/L

## 2024-07-29 LAB — TSH: TSH: 2.31 m[IU]/L

## 2024-08-01 ENCOUNTER — Ambulatory Visit: Payer: Self-pay | Admitting: Nurse Practitioner

## 2024-08-02 DIAGNOSIS — M722 Plantar fascial fibromatosis: Secondary | ICD-10-CM | POA: Diagnosis not present

## 2024-08-02 DIAGNOSIS — M79671 Pain in right foot: Secondary | ICD-10-CM | POA: Diagnosis not present

## 2024-08-08 ENCOUNTER — Encounter: Payer: Self-pay | Admitting: Nurse Practitioner

## 2024-08-08 ENCOUNTER — Other Ambulatory Visit (HOSPITAL_COMMUNITY): Payer: Self-pay

## 2024-08-08 ENCOUNTER — Telehealth: Payer: Self-pay | Admitting: Pharmacy Technician

## 2024-08-08 NOTE — Telephone Encounter (Signed)
 Pharmacy Patient Advocate Encounter   Received notification from CoverMyMeds that prior authorization for Zepbound  2.5MG /0.5ML pen-injectors is required/requested.   Insurance verification completed.   The patient is insured through [facility] .   Per test claim: The current 28 day co-pay is, $1035.19.  No PA needed at this time. This test claim was processed through [facility]- copay amounts may vary at other pharmacies due to pharmacy/plan contracts, or as the patient moves through the different stages of their insurance plan.    Ms.Aymond prescription plan do not pay for Zepbound  or Wegovy BUT, they did allow the claim to go through and they discounted the cost which will allow the patient to get a coupon if they go to zepbound .com and that will bring the cost down to either $550.00 or $650.00

## 2024-08-08 NOTE — Telephone Encounter (Signed)
 FYI

## 2024-08-11 ENCOUNTER — Telehealth: Payer: Self-pay | Admitting: Pharmacy Technician

## 2024-08-11 ENCOUNTER — Other Ambulatory Visit (HOSPITAL_COMMUNITY): Payer: Self-pay

## 2024-08-11 NOTE — Telephone Encounter (Signed)
 Good morning her secondary paid and her primary requires PA, I will go ahead and submit the PA to her primary. Thanks for the clarification about primary and secondary.

## 2024-08-11 NOTE — Telephone Encounter (Signed)
 PA request has been Submitted. New Encounter has been or will be created for follow up. For additional info see Pharmacy Prior Auth telephone encounter from 08/11/24.

## 2024-08-11 NOTE — Telephone Encounter (Signed)
 Pharmacy Patient Advocate Encounter   Received notification from Pt Calls Messages that prior authorization for Zepbound  2.5MG /0.5ML pen-injectors  is required/requested.   Insurance verification completed.   The patient is insured through [facility] .   Per test claim: PA required; PA submitted to above mentioned insurance via Latent Key/confirmation #/EOC [id number] Status is pending

## 2024-08-18 ENCOUNTER — Other Ambulatory Visit (HOSPITAL_COMMUNITY): Payer: Self-pay

## 2024-08-18 NOTE — Telephone Encounter (Signed)
 Pharmacy Patient Advocate Encounter  Received notification from [facility] that Prior Authorization for Zepbound  2.5MG /0.5ML pen-injectors  has been APPROVED from 08/18/24 to 02/08/25. Unable to obtain price due to refill too soon rejection, last fill date 08/05/2024 next available fill date09/27/2025   PA #/Case ID/Reference #: [id number]

## 2024-08-30 ENCOUNTER — Other Ambulatory Visit (HOSPITAL_COMMUNITY)
Admission: RE | Admit: 2024-08-30 | Discharge: 2024-08-30 | Disposition: A | Discharge status: Home / Self Care | Source: Ambulatory Visit | Attending: Nurse Practitioner | Admitting: Nurse Practitioner

## 2024-08-30 ENCOUNTER — Encounter: Payer: Self-pay | Admitting: Nurse Practitioner

## 2024-08-30 ENCOUNTER — Ambulatory Visit (INDEPENDENT_AMBULATORY_CARE_PROVIDER_SITE_OTHER): Admitting: Nurse Practitioner

## 2024-08-30 VITALS — BP 130/76 | HR 93 | Temp 98.0°F | Resp 18 | Ht 60.0 in | Wt 174.0 lb

## 2024-08-30 DIAGNOSIS — Z6834 Body mass index (BMI) 34.0-34.9, adult: Secondary | ICD-10-CM

## 2024-08-30 DIAGNOSIS — N760 Acute vaginitis: Secondary | ICD-10-CM | POA: Diagnosis not present

## 2024-08-30 DIAGNOSIS — Z23 Encounter for immunization: Secondary | ICD-10-CM

## 2024-08-30 DIAGNOSIS — E66811 Obesity, class 1: Secondary | ICD-10-CM

## 2024-08-30 DIAGNOSIS — E6609 Other obesity due to excess calories: Secondary | ICD-10-CM | POA: Insufficient documentation

## 2024-08-30 MED ORDER — ZEPBOUND 5 MG/0.5ML ~~LOC~~ SOAJ: 5.0000 mg | SUBCUTANEOUS | 0 refills | Status: DC

## 2024-08-30 MED ORDER — FLUCONAZOLE 150 MG PO TABS: 150.0000 mg | ORAL_TABLET | ORAL | 0 refills | Status: DC | PRN

## 2024-08-30 NOTE — Progress Notes (Signed)
 BP 130/76   Pulse 93   Temp 98 F (36.7 C)   Resp 18   Ht 5' (1.524 m)   Wt 174 lb (78.9 kg)   SpO2 100%   BMI 33.98 kg/m    Subjective:    Patient ID: Mackenzie Jones, female    DOB: Jun 03, 1971, 53 y.o.   MRN: [id number]  HPI: Mackenzie Jones is a 53 y.o. female  Chief Complaint  Patient presents with   Vaginitis    Itching and gischarge    Patient presents with vaginal itching and white, thick discharge for the past 2-3 weeks. Denies any foul odor, dysuria, redness, or edema. Denies any recent antibiotic use. Per patient, last yeast infection was years ago. Denies any systemic symptoms, including fever or fatigue. She is sexually active with one female partner, her husband.  In addition, patient requests a refill for Tirzepatide . Tolerating current dose well and reports no side effects. Patient consumes a healthy diet and exercises. Walks approximately 10,000 steps per day at work. Has lost 1.2 kg since last visit. Last appointment weight:176 lbs, BMI 34.47 Last Weight  Most recent update: 08/30/2024  8:55 AM    Weight  78.9 kg (174 lb)            Body mass index is 33.98 kg/m. Flowsheet Row Office Visit from 07/28/2024 in [facility] Office Visit from 02/02/2017 in [facility]  1 39 inches 37.5 inches        07/28/2024    7:47 AM 06/09/2024   11:35 AM 04/25/2024   11:09 AM  Depression screen PHQ 2/9  Decreased Interest 0 0 0  Down, Depressed, Hopeless 0 0 0  PHQ - 2 Score 0 0 0  Altered sleeping  0 0  Tired, decreased energy  0 0  Change in appetite  0 0  Feeling bad or failure about yourself   0 0  Trouble concentrating  0 0  Moving slowly or fidgety/restless  0 0  Suicidal thoughts  0 0  PHQ-9 Score  0 0  Difficult doing work/chores  Not difficult at all Not difficult at all    Relevant past medical, surgical, family and social history reviewed and updated as indicated. Interim medical history since  our last visit reviewed. Allergies and medications reviewed and updated.  Review of Systems  Constitutional: Negative for fever or weight change.  Respiratory: Negative for cough and shortness of breath.   Cardiovascular: Negative for chest pain or palpitations.  Gastrointestinal: Negative for abdominal pain, no bowel changes.  Musculoskeletal: Negative for gait problem or joint swelling.  Skin: Negative for rash.  Neurological: Negative for dizziness or headache.  No other specific complaints in a complete review of systems (except as listed in HPI above).      Objective:     BP 130/76   Pulse 93   Temp 98 F (36.7 C)   Resp 18   Ht 5' (1.524 m)   Wt 174 lb (78.9 kg)   SpO2 100%   BMI 33.98 kg/m    Wt Readings from Last 3 Encounters:  08/30/24 174 lb (78.9 kg)  07/28/24 176 lb 8 oz (80.1 kg)  06/09/24 171 lb 1.6 oz (77.6 kg)    Physical Exam Constitutional:      Appearance: Normal appearance.  Cardiovascular:     Rate and Rhythm: Normal rate and regular rhythm.     Heart sounds: Normal heart  sounds.  Pulmonary:     Effort: Pulmonary effort is normal.     Breath sounds: Normal breath sounds.  Abdominal:     General: Bowel sounds are normal.     Palpations: Abdomen is soft.     Comments: Negative for abdominal, suprapubic, or CVA tenderness.  Skin:    General: Skin is warm and dry.  Neurological:     General: No focal deficit present.     Mental Status: She is alert and oriented to person, place, and time. Mental status is at baseline.  Psychiatric:        Mood and Affect: Mood normal.        Behavior: Behavior normal.        Thought Content: Thought content normal.        Judgment: Judgment normal.      Results for orders placed or performed in visit on 07/28/24  Lipid panel   Collection Time: 07/28/24  8:19 AM  Result Value Ref Range   Cholesterol 185 <200 mg/dL   HDL 53 > OR = 50 mg/dL   Triglycerides 99 <849 mg/dL   LDL Cholesterol (Calc) 111  (H) mg/dL (calc)   Total CHOL/HDL Ratio 3.5 <5.0 (calc)   Non-HDL Cholesterol (Calc) 132 (H) <130 mg/dL (calc)  Hemoglobin J8r   Collection Time: 07/28/24  8:19 AM  Result Value Ref Range   Hgb A1c MFr Bld 5.5 <5.7 %   Mean Plasma Glucose 111 mg/dL   eAG (mmol/L) 6.2 mmol/L  CBC with Differential/Platelet   Collection Time: 07/28/24  8:19 AM  Result Value Ref Range   WBC 7.2 3.8 - 10.8 Thousand/uL   RBC 4.14 3.80 - 5.10 Million/uL   Hemoglobin 12.1 11.7 - 15.5 g/dL   HCT 63.2 64.9 - 54.9 %   MCV 88.6 80.0 - 100.0 fL   MCH 29.2 27.0 - 33.0 pg   MCHC 33.0 32.0 - 36.0 g/dL   RDW 87.4 88.9 - 84.9 %   Platelets 372 140 - 400 Thousand/uL   MPV 10.5 7.5 - 12.5 fL   Neutro Abs 3,456 1,500 - 7,800 cells/uL   Absolute Lymphocytes 3,096 850 - 3,900 cells/uL   Absolute Monocytes 454 200 - 950 cells/uL   Eosinophils Absolute 151 15 - 500 cells/uL   Basophils Absolute 43 0 - 200 cells/uL   Neutrophils Relative % 48 %   Total Lymphocyte 43.0 %   Monocytes Relative 6.3 %   Eosinophils Relative 2.1 %   Basophils Relative 0.6 %  Comprehensive metabolic panel with GFR   Collection Time: 07/28/24  8:19 AM  Result Value Ref Range   Glucose, Bld 101 (H) 65 - 99 mg/dL   BUN 18 7 - 25 mg/dL   Creat 9.25 9.49 - 8.96 mg/dL   eGFR 96 > OR = 60 fO/fpw/8.26f7   BUN/Creatinine Ratio SEE NOTE: 6 - 22 (calc)   Sodium 139 135 - 146 mmol/L   Potassium 4.3 3.5 - 5.3 mmol/L   Chloride 104 98 - 110 mmol/L   CO2 28 20 - 32 mmol/L   Calcium  9.6 8.6 - 10.4 mg/dL   Total Protein 7.2 6.1 - 8.1 g/dL   Albumin 4.6 3.6 - 5.1 g/dL   Globulin 2.6 1.9 - 3.7 g/dL (calc)   AG Ratio 1.8 1.0 - 2.5 (calc)   Total Bilirubin 0.5 0.2 - 1.2 mg/dL   Alkaline phosphatase (APISO) 109 37 - 153 U/L   AST 15 10 - 35 U/L  ALT 19 6 - 29 U/L  TSH   Collection Time: 07/28/24  8:19 AM  Result Value Ref Range   TSH 2.31 mIU/L          Assessment & Plan:   Problem List Items Addressed This Visit       Other   Class  1 obesity due to excess calories with serious comorbidity and body mass index (BMI) of 34.0 to 34.9 in adult   Continues to eat healthy diet and exercise. Has lost weight since last visit. Tirzepatide  dose increased. Tolerating current dose well, without side effects.   - Encourage continuation of lifestyle modifications, including dietary management and regular exercise. -continue to increase physical activity, getting at least 150 min of physical activity a week.  Work on including Runner, broadcasting/film/video 2 days a week.  - continue eating at a calorie deficit 1600-1700 cal a day, eating a well balanced diet with whole foods, avoiding processed foods.   Patient is motivated to continue working on lifestyle modification.        Relevant Medications   tirzepatide  (ZEPBOUND ) 5 MG/0.5ML Pen   Other Visit Diagnoses       Acute vaginitis    -  Primary   Patient presents with white, thick discharge. Vaginal swab and Diflucan  ordered.   Relevant Medications   fluconazole  (DIFLUCAN ) 150 MG tablet   Other Relevant Orders   Cervicovaginal ancillary only     Immunization due       Relevant Orders   Flu vaccine trivalent PF, 6mos and older(Flulaval,Afluria,Fluarix,Fluzone) (Completed)            Follow up plan: Return in about 3 months (around 11/30/2024) for follow up.     I have reviewed this encounter including the documentation in this note and/or discussed this patient with the provider, Alexa Everhart SNP, I am certifying that I agree with the content of this note as supervising/preceptor nurse practitioner.  Mliss Spray, FNP-C [facility] 08/30/2024, 10:04 AM

## 2024-08-30 NOTE — Assessment & Plan Note (Signed)
 Continues to eat healthy diet and exercise. Has lost weight since last visit. Tirzepatide  dose increased. Tolerating current dose well, without side effects.   - Encourage continuation of lifestyle modifications, including dietary management and regular exercise. -continue to increase physical activity, getting at least 150 min of physical activity a week.  Work on including Runner, broadcasting/film/video 2 days a week.  - continue eating at a calorie deficit 1600-1700 cal a day, eating a well balanced diet with whole foods, avoiding processed foods.   Patient is motivated to continue working on lifestyle modification.

## 2024-08-31 ENCOUNTER — Ambulatory Visit: Payer: Self-pay | Admitting: Nurse Practitioner

## 2024-08-31 LAB — CERVICOVAGINAL ANCILLARY ONLY
Bacterial Vaginitis (gardnerella): NEGATIVE
Candida Glabrata: NEGATIVE
Candida Vaginitis: NEGATIVE
Chlamydia: NEGATIVE
Comment: NEGATIVE
Comment: NEGATIVE
Comment: NEGATIVE
Comment: NEGATIVE
Comment: NEGATIVE
Comment: NORMAL
Neisseria Gonorrhea: NEGATIVE
Trichomonas: NEGATIVE

## 2024-09-16 ENCOUNTER — Other Ambulatory Visit: Payer: Self-pay | Admitting: Nurse Practitioner

## 2024-09-16 DIAGNOSIS — I10 Essential (primary) hypertension: Secondary | ICD-10-CM

## 2024-09-18 ENCOUNTER — Telehealth: Payer: Self-pay

## 2024-09-18 NOTE — Telephone Encounter (Signed)
 Patient called for a refill of her prometrium 100mg  po daily.  It was originally prescribed by Dr. Connell.  She had her last appointment with [facility] CNM but it does not appear that medication was refilled.  Message forwarded to [facility] for refill.  She would also like to note that she is not taking the Veozah  as it was expensive and did not seem to help much.

## 2024-09-19 ENCOUNTER — Encounter: Payer: Self-pay | Admitting: Nurse Practitioner

## 2024-09-19 ENCOUNTER — Other Ambulatory Visit: Payer: Self-pay | Admitting: Certified Nurse Midwife

## 2024-09-19 MED ORDER — PROGESTERONE MICRONIZED 100 MG PO CAPS: 100.0000 mg | ORAL_CAPSULE | Freq: Every day | ORAL | 3 refills | Status: AC

## 2024-09-19 NOTE — Telephone Encounter (Signed)
 Too soon for refill, LRF 06/09/24 FOR 90 AND 1 RF.  Requested Prescriptions  Pending Prescriptions Disp Refills   amLODipine  (NORVASC ) 5 MG tablet [Pharmacy Med Name: AMLODIPINE  BESYLATE 5 MG TAB] 30 tablet     Sig: TAKE 1 TABLET (5 MG TOTAL) BY MOUTH DAILY.     Cardiovascular: Calcium  Channel Blockers 2 Passed - 09/19/2024 11:04 AM      Passed - Last BP in normal range    BP Readings from Last 1 Encounters:  08/30/24 130/76         Passed - Last Heart Rate in normal range    Pulse Readings from Last 1 Encounters:  08/30/24 93         Passed - Valid encounter within last 6 months    Recent Outpatient Visits           2 weeks ago Acute vaginitis   [facility] Gareth Mliss FALCON, FNP   1 month ago Screening for diabetes mellitus   [facility] Gareth Mliss FALCON, FNP   3 months ago Essential hypertension   [facility] Gareth Mliss FALCON, FNP   4 months ago Essential hypertension   [facility] Gareth Mliss FALCON, FNP   8 years ago Calf pain, left   [facility] Blunt, Natalie, DO       Future Appointments             In 1 month Gareth, Mliss FALCON, FNP [facility] [place]

## 2024-09-23 ENCOUNTER — Other Ambulatory Visit: Payer: Self-pay | Admitting: Nurse Practitioner

## 2024-09-23 DIAGNOSIS — E66811 Obesity, class 1: Secondary | ICD-10-CM

## 2024-09-26 NOTE — Telephone Encounter (Signed)
 Requested medication (s) are due for refill today: yes  Requested medication (s) are on the active medication list: yes  Last refill:  08/30/24  Future visit scheduled: yes  Notes to clinic:   Medication not assigned to a protocol, review manually.      Requested Prescriptions  Pending Prescriptions Disp Refills   ZEPBOUND  5 MG/0.5ML Pen [Pharmacy Med Name: ZEPBOUND  5 MG/0.5 ML PEN[R^]] 2 mL 0    Sig: INJECT THE CONTENTS OF ONE PEN UNDER THE SKIN ONCE WEEKLY ON THE SAME DAY EACH WEEK.     Off-Protocol Failed - 09/26/2024  8:56 AM      Failed - Medication not assigned to a protocol, review manually.      Passed - Valid encounter within last 12 months    Recent Outpatient Visits           3 weeks ago Acute vaginitis   [facility] Gareth Clarity F, FNP   2 months ago Screening for diabetes mellitus   [facility] Gareth Clarity FALCON, FNP   3 months ago Essential hypertension   [facility] Gareth Clarity FALCON, FNP   5 months ago Essential hypertension   [facility] Gareth Clarity FALCON, FNP   8 years ago Calf pain, left   [facility]ni Blunt, Natalie, DO       Future Appointments             In 1 month Gareth, Clarity FALCON, FNP [facility], [place]

## 2024-10-04 ENCOUNTER — Ambulatory Visit (INDEPENDENT_AMBULATORY_CARE_PROVIDER_SITE_OTHER): Admitting: Nurse Practitioner

## 2024-10-04 VITALS — BP 132/92 | HR 88 | Temp 98.7°F | Ht 60.0 in | Wt 165.0 lb

## 2024-10-04 DIAGNOSIS — M25511 Pain in right shoulder: Secondary | ICD-10-CM

## 2024-10-04 MED ORDER — NAPROXEN 500 MG PO TABS: 500.0000 mg | ORAL_TABLET | Freq: Two times a day (BID) | ORAL | 0 refills | Status: AC

## 2024-10-04 NOTE — Progress Notes (Signed)
 BP (!) 132/92   Pulse 88   Temp 98.7 F (37.1 C)   Ht 5' (1.524 m)   Wt 165 lb (74.8 kg)   SpO2 99%   BMI 32.22 kg/m    Subjective:    Patient ID: Mackenzie Jones, female    DOB: 06/01/1971, 53 y.o.   MRN: [id number]  HPI: Mackenzie Jones is a 53 y.o. female  Chief Complaint  Patient presents with   Shoulder Pain    Pt c/o right shoulder pain x2 weeks. Limited range of motion. Denies injury, numbness, tingling.     Patient presents with right shoulder pain for the past 2 weeks. Denies any injury or trauma, but felt her right shoulder drop and heard a clicking sound. Anterior side of shoulder is described as sore, specifically at the joint. Pain does not radiate anywhere. Denies arm, back, or neck pain. Endorses limited range of motion due to soreness. Denies any swelling, numbness, tingling, or weakness. Denies any chest pain or shortness of breath. Has not tried anything to alleviate pain. Discussed with patient about prescribing Naproxen BID for 10 days. If no improvement, will refer to orthopedics.      07/28/2024    7:47 AM 06/09/2024   11:35 AM 04/25/2024   11:09 AM  Depression screen PHQ 2/9  Decreased Interest 0 0 0  Down, Depressed, Hopeless 0 0 0  PHQ - 2 Score 0 0 0  Altered sleeping  0 0  Tired, decreased energy  0 0  Change in appetite  0 0  Feeling bad or failure about yourself   0 0  Trouble concentrating  0 0  Moving slowly or fidgety/restless  0 0  Suicidal thoughts  0 0  PHQ-9 Score  0 0  Difficult doing work/chores  Not difficult at all Not difficult at all    Relevant past medical, surgical, family and social history reviewed and updated as indicated. Interim medical history since our last visit reviewed. Allergies and medications reviewed and updated.  Review of Systems  Constitutional: Negative for fever or weight change.  Respiratory: Negative for cough and shortness of breath.   Cardiovascular: Negative for chest pain or palpitations.   Gastrointestinal: Negative for abdominal pain, no bowel changes.  Musculoskeletal: Negative for gait problem or joint swelling. Endorses right shoulder soreness.  Skin: Negative for rash.  Neurological: Negative for dizziness or headache.  No other specific complaints in a complete review of systems (except as listed in HPI above).     Objective:     BP (!) 132/92   Pulse 88   Temp 98.7 F (37.1 C)   Ht 5' (1.524 m)   Wt 165 lb (74.8 kg)   SpO2 99%   BMI 32.22 kg/m    Wt Readings from Last 3 Encounters:  10/04/24 165 lb (74.8 kg)  08/30/24 174 lb (78.9 kg)  07/28/24 176 lb 8 oz (80.1 kg)    Physical Exam Constitutional:      Appearance: Normal appearance.  Cardiovascular:     Rate and Rhythm: Normal rate and regular rhythm.     Heart sounds: Normal heart sounds.  Pulmonary:     Effort: Pulmonary effort is normal.     Breath sounds: Normal breath sounds.  Musculoskeletal:     Right shoulder: No swelling, deformity or tenderness. Decreased range of motion.     Comments: Decreased range of motion due to soreness. Able to perform active range of motion movements including flexion, extension, abduction,  and adduction.  Skin:    General: Skin is warm and dry.  Neurological:     General: No focal deficit present.     Mental Status: She is alert and oriented to person, place, and time. Mental status is at baseline.  Psychiatric:        Mood and Affect: Mood normal.        Behavior: Behavior normal.        Thought Content: Thought content normal.        Judgment: Judgment normal.             Assessment & Plan:   Problem List Items Addressed This Visit   None Visit Diagnoses       Acute pain of right shoulder    -  Primary   Right shoulder soreness for 2 weeks. Naproxen BID ordered for 10 days. If no improvement by day 7 will refer to orthopedics.   Relevant Medications   naproxen (NAPROSYN) 500 MG tablet            Follow up plan: Return if symptoms  worsen or fail to improve.  I have reviewed this encounter including the documentation in this note and/or discussed this patient with the provider, Alexa Everhart SNP, I am certifying that I agree with the content of this note as supervising/preceptor nurse practitioner.  Mliss Spray, FNP-C [facility] 10/04/2024, 12:39 PM

## 2024-10-30 ENCOUNTER — Ambulatory Visit
Admission: RE | Admit: 2024-10-30 | Discharge: 2024-10-30 | Disposition: A | Discharge status: Home / Self Care | Attending: Nurse Practitioner | Admitting: Nurse Practitioner

## 2024-10-30 ENCOUNTER — Ambulatory Visit
Admission: RE | Admit: 2024-10-30 | Discharge: 2024-10-30 | Disposition: A | Source: Ambulatory Visit | Attending: Nurse Practitioner

## 2024-10-30 ENCOUNTER — Ambulatory Visit: Payer: Self-pay | Admitting: Nurse Practitioner

## 2024-10-30 ENCOUNTER — Other Ambulatory Visit: Payer: Self-pay

## 2024-10-30 ENCOUNTER — Ambulatory Visit (INDEPENDENT_AMBULATORY_CARE_PROVIDER_SITE_OTHER): Admitting: Nurse Practitioner

## 2024-10-30 VITALS — BP 134/82 | HR 95 | Temp 98.1°F | Resp 16 | Ht 60.5 in | Wt 171.4 lb

## 2024-10-30 DIAGNOSIS — M25511 Pain in right shoulder: Secondary | ICD-10-CM

## 2024-10-30 DIAGNOSIS — E782 Mixed hyperlipidemia: Secondary | ICD-10-CM | POA: Diagnosis not present

## 2024-10-30 DIAGNOSIS — E6609 Other obesity due to excess calories: Secondary | ICD-10-CM

## 2024-10-30 DIAGNOSIS — G8929 Other chronic pain: Secondary | ICD-10-CM

## 2024-10-30 DIAGNOSIS — I10 Essential (primary) hypertension: Secondary | ICD-10-CM

## 2024-10-30 DIAGNOSIS — Z6834 Body mass index (BMI) 34.0-34.9, adult: Secondary | ICD-10-CM

## 2024-10-30 DIAGNOSIS — E66811 Obesity, class 1: Secondary | ICD-10-CM

## 2024-10-30 NOTE — Progress Notes (Signed)
 BP 134/82 (Cuff Size: Large)   Pulse 95   Temp 98.1 F (36.7 C) (Oral)   Resp 16   Ht 5' 0.5 (1.537 m)   Wt 171 lb 6.4 oz (77.7 kg)   SpO2 99%   BMI 32.92 kg/m    Subjective:    Patient ID: Mackenzie Jones, female    DOB: [birth year], 53 y.o.   MRN: [id number]  HPI: Mackenzie Jones is a 53 y.o. female  Chief Complaint  Patient presents with   Medical Management of Chronic Issues    3 month recheck   Discussed the use of AI scribe software for clinical note transcription with the patient, who gave verbal consent to proceed.  History of Present Illness Mackenzie Jones is a 53 year old female who presents for a three-month follow-up.  Right shoulder pain - Soreness localized to the anterior aspect of the right shoulder joint for almost two months - No radiation of pain - No history of injury or trauma - Recalled sensation of shoulder dropping and hearing a clicking sound - Naproxen  500 mg twice daily for ten days provided slight improvement - Performing stretches and exercises  Hypertension - Blood pressure today 134/82 mmHg - Previous blood pressure 132/92 mmHg at last appointment - Currently taking amlodipine  5 mg daily  Hyperlipidemia - Currently taking rosuvastatin  5 mg daily - Last lipid panel in August 2025 showed LDL 111 mg/dL  Glycemic control - Mackenzie Jones was 5.5% in August 2025  Weight management and lifestyle modification - Weight was 165 pounds at last appointment, down from 174 pounds in October - Current weight is 171 pounds following Thanksgiving - Maintaining a calorie deficit - Generally maintains a good diet and exercise routine    Wt Readings from Last 3 Encounters:  10/30/24 171 lb 6.4 oz (77.7 kg)  10/04/24 165 lb (74.8 kg)  08/30/24 174 lb (78.9 kg)     Body mass index is 32.92 kg/m.  Flowsheet Row Office Visit from 10/30/2024 in [facility] Office Visit from 10/04/2024 in [facility] Office Visit from  08/30/2024 in [facility]  1 37 inches 38.5 inches 38.5 inches    BP Readings from Last 3 Encounters:  10/30/24 134/82  10/04/24 (!) 132/92  08/30/24 130/76       07/28/2024    7:47 AM 06/09/2024   11:35 AM 04/25/2024   11:09 AM  Depression screen PHQ 2/9  Decreased Interest 0 0 0  Down, Depressed, Hopeless 0 0 0  PHQ - 2 Score 0 0 0  Altered sleeping  0 0  Tired, decreased energy  0 0  Change in appetite  0 0  Feeling bad or failure about yourself   0 0  Trouble concentrating  0 0  Moving slowly or fidgety/restless  0 0  Suicidal thoughts  0 0  PHQ-9 Score  0  0   Difficult doing work/chores  Not difficult at all Not difficult at all     Data saved with a previous flowsheet row definition    Relevant past medical, surgical, family and social history reviewed and updated as indicated. Interim medical history since our last visit reviewed. Allergies and medications reviewed and updated.  Review of Systems  Ten systems reviewed and is negative except as mentioned in HPI      Objective:      BP 134/82 (Cuff Size: Large)   Pulse 95   Temp 98.1 F (36.7  C) (Oral)   Resp 16   Ht 5' 0.5 (1.537 m)   Wt 171 lb 6.4 oz (77.7 kg)   SpO2 99%   BMI 32.92 kg/m    Wt Readings from Last 3 Encounters:  10/30/24 171 lb 6.4 oz (77.7 kg)  10/04/24 165 lb (74.8 kg)  08/30/24 174 lb (78.9 kg)    Physical Exam VITALS: BP- 134/82 MEASUREMENTS: Weight- 171. GENERAL: Alert, cooperative, well developed, no acute distress. HEENT: Normocephalic, normal oropharynx, moist mucous membranes. CHEST: Clear to auscultation bilaterally, no wheezes, rhonchi, or crackles. CARDIOVASCULAR: Regular rate and rhythm, S1 and S2 normal without murmurs. ABDOMEN: Soft, non-tender, non-distended, without organomegaly, normal bowel sounds. EXTREMITIES: No cyanosis or edema. NEUROLOGICAL: Cranial nerves grossly intact, moves all extremities without gross motor or sensory  deficit.          Assessment & Plan:   Problem List Items Addressed This Visit       Cardiovascular and Mediastinum   Essential hypertension - Primary     Other   Chronic right shoulder pain   Relevant Orders   MR Shoulder Right Wo Contrast   DG Shoulder Right   Mixed hyperlipidemia   Class 1 obesity due to excess calories with serious comorbidity and body mass index (BMI) of 34.0 to 34.9 in adult     Assessment and Plan Assessment & Plan Right shoulder pain Persisting for almost two months, localized to the anterior side of the shoulder at the joint, without radiation. No history of injury or trauma, but reports a sensation of the shoulder dropping and a clicking sound. Slight improvement with naproxen  500 mg BID for ten days.  - Ordered x-ray of right shoulder - Ordered MRI of right shoulder after x-ray results are available  Essential hypertension Blood pressure improved to 134/82 mmHg from 132/92 mmHg at last visit. Currently managed with amlodipine  5 mg daily.  Obesity, class 1 Weight increased to 171 pounds from 165 pounds at last visit, attributed to recent Thanksgiving. Normally maintains a calorie deficit diet and exercise regimen. - Encourage continuation of lifestyle modifications, including dietary management and regular exercise. -continue to increase physical activity, getting at least 150 min of physical activity a week.  Work on including runner, broadcasting/film/video 2 days a week.  - continue eating at a calorie deficit 1600-1700 cal a day, eating a well balanced diet with whole foods, avoiding processed foods.   Patient is motivated to continue working on lifestyle modification.    Mixed hyperlipidemia Last lipid panel in August 2025 showed LDL of 111 mg/dL. Currently managed with rosuvastatin  5 mg daily.        Follow up plan: Return in about 3 months (around 01/28/2025) for follow up.

## 2024-11-01 ENCOUNTER — Other Ambulatory Visit: Payer: Self-pay | Admitting: Nurse Practitioner

## 2024-11-01 DIAGNOSIS — E6609 Other obesity due to excess calories: Secondary | ICD-10-CM

## 2024-11-03 NOTE — Telephone Encounter (Signed)
 Requested medications are due for refill today.  yes  Requested medications are on the active medications list.  yes  Last refill. 09/26/2024 2mL 0 rf  Future visit scheduled.   no  Notes to clinic.  Medication not assigned to a protocol. Please review for refill.    Requested Prescriptions  Pending Prescriptions Disp Refills   ZEPBOUND  5 MG/0.5ML Pen [Pharmacy Med Name: ZEPBOUND  5 MG/0.5 ML PEN[R^]] 2 mL 0    Sig: INJECT THE CONTENTS OF ONE PEN UNDER THE SKIN ONCE WEEKLY ON THE SAME DAY EACH WEEK.     Off-Protocol Failed - 11/03/2024  5:14 PM      Failed - Medication not assigned to a protocol, review manually.      Passed - Valid encounter within last 12 months    Recent Outpatient Visits           4 days ago Essential hypertension   [facility] Gareth Clarity F, FNP   1 month ago Acute pain of right shoulder   [facility] Gareth Clarity FALCON, FNP   2 months ago Acute vaginitis   [facility] Gareth Clarity FALCON, FNP   3 months ago Screening for diabetes mellitus   [facility] Gareth Clarity FALCON, FNP   4 months ago Essential hypertension   [facility] Gareth Clarity FALCON, [place]

## 2024-11-07 ENCOUNTER — Ambulatory Visit: Admission: RE | Admit: 2024-11-07 | Discharge: 2024-11-07 | Attending: Nurse Practitioner

## 2024-11-07 DIAGNOSIS — S46211A Strain of muscle, fascia and tendon of other parts of biceps, right arm, initial encounter: Secondary | ICD-10-CM | POA: Diagnosis not present

## 2024-11-07 DIAGNOSIS — G8929 Other chronic pain: Secondary | ICD-10-CM | POA: Diagnosis not present

## 2024-11-07 DIAGNOSIS — M25511 Pain in right shoulder: Secondary | ICD-10-CM | POA: Diagnosis not present

## 2024-11-10 ENCOUNTER — Other Ambulatory Visit: Payer: Self-pay | Admitting: Nurse Practitioner

## 2024-11-10 DIAGNOSIS — G8929 Other chronic pain: Secondary | ICD-10-CM

## 2024-11-10 DIAGNOSIS — M67813 Other specified disorders of tendon, right shoulder: Secondary | ICD-10-CM

## 2024-11-26 ENCOUNTER — Other Ambulatory Visit: Payer: Self-pay | Admitting: Nurse Practitioner

## 2024-11-26 DIAGNOSIS — E66811 Obesity, class 1: Secondary | ICD-10-CM

## 2024-11-28 DIAGNOSIS — M25511 Pain in right shoulder: Secondary | ICD-10-CM | POA: Diagnosis not present

## 2024-11-28 NOTE — Telephone Encounter (Signed)
 Requested medication (s) are due for refill today: Yes  Requested medication (s) are on the active medication list: Yes  Last refill:  11/06/24  Future visit scheduled: No  Notes to clinic:  Manual review.    Requested Prescriptions  Pending Prescriptions Disp Refills   ZEPBOUND  5 MG/0.5ML Pen [Pharmacy Med Name: ZEPBOUND  5 MG/0.5 ML PEN[R^]]  0    Sig: INJECT THE CONTENTS OF ONE PEN UNDER THE SKIN ONCE WEEKLY ON THE SAME DAY EACH WEEK.     Off-Protocol Failed - 11/28/2024  1:36 PM      Failed - Medication not assigned to a protocol, review manually.      Passed - Valid encounter within last 12 months    Recent Outpatient Visits           4 weeks ago Essential hypertension   [facility] Gareth Clarity F, FNP   1 month ago Acute pain of right shoulder   [facility] Gareth Clarity FALCON, FNP   3 months ago Acute vaginitis   [facility] Gareth Clarity FALCON, FNP   4 months ago Screening for diabetes mellitus   [facility] Gareth Clarity FALCON, FNP   5 months ago Essential hypertension   [facility] Gareth Clarity FALCON, [place]

## 2024-12-13 ENCOUNTER — Other Ambulatory Visit: Payer: Self-pay | Admitting: Nurse Practitioner

## 2024-12-13 DIAGNOSIS — E782 Mixed hyperlipidemia: Secondary | ICD-10-CM

## 2024-12-14 NOTE — Telephone Encounter (Signed)
 Requested Prescriptions  Pending Prescriptions Disp Refills   rosuvastatin  (CRESTOR ) 5 MG tablet [Pharmacy Med Name: ROSUVASTATIN  CALCIUM  5 MG TAB] 90 tablet 1    Sig: TAKE 1 TABLET BY MOUTH EVERYDAY AT BEDTIME     Cardiovascular:  Antilipid - Statins 2 Failed - 12/14/2024  1:39 PM      Failed - Lipid Panel in normal range within the last 12 months    Cholesterol, Total  Date Value Ref Range Status  02/02/2017 256 (H) 100 - 199 mg/dL Final   Cholesterol  Date Value Ref Range Status  07/28/2024 185 <200 mg/dL Final   LDL Cholesterol (Calc)  Date Value Ref Range Status  07/28/2024 111 (H) mg/dL (calc) Final    Comment:    Reference range: <100 . Desirable range <100 mg/dL for primary prevention;   <70 mg/dL for patients with CHD or diabetic patients  with > or = 2 CHD risk factors. SABRA LDL-C is now calculated using the Martin-Hopkins  calculation, which is a validated novel method providing  better accuracy than the Friedewald equation in the  estimation of LDL-C.  Gladis APPLETHWAITE et al. SANDREA. 7986;689(80): 2061-2068  (http://education.[employer].com/faq/FAQ164)    HDL  Date Value Ref Range Status  07/28/2024 53 > OR = 50 mg/dL Final  96/93/7981 48 >60 mg/dL Final   Triglycerides  Date Value Ref Range Status  07/28/2024 99 <150 mg/dL Final         Passed - Cr in normal range and within 360 days    Creat  Date Value Ref Range Status  07/28/2024 0.74 0.50 - 1.03 mg/dL Final         Passed - Patient is not pregnant      Passed - Valid encounter within last 12 months    Recent Outpatient Visits           1 month ago Essential hypertension   [facility] Gareth Mliss FALCON, FNP   2 months ago Acute pain of right shoulder   [facility] Gareth Mliss FALCON, FNP   3 months ago Acute vaginitis   [facility] Gareth Mliss FALCON, FNP   4 months ago Screening for diabetes mellitus   [facility] Gareth Mliss FALCON, FNP   6 months ago Essential hypertension   [facility] Gareth Mliss FALCON, [place]
